# Patient Record
Sex: Female | Born: 1980 | Hispanic: Yes | Marital: Single | Smoking: Never smoker
Health system: Southern US, Community
[De-identification: ages and names within clinical notes are randomized; demographics above are authoritative.]

## PROBLEM LIST (undated history)

## (undated) ENCOUNTER — Inpatient Hospital Stay: Payer: Self-pay

## (undated) DIAGNOSIS — O09529 Supervision of elderly multigravida, unspecified trimester: Secondary | ICD-10-CM

## (undated) DIAGNOSIS — D649 Anemia, unspecified: Secondary | ICD-10-CM

## (undated) DIAGNOSIS — O24419 Gestational diabetes mellitus in pregnancy, unspecified control: Secondary | ICD-10-CM

## (undated) HISTORY — DX: Gestational diabetes mellitus in pregnancy, unspecified control: O24.419

## (undated) HISTORY — PX: CHOLECYSTECTOMY: SHX55

## (undated) HISTORY — DX: Supervision of elderly multigravida, unspecified trimester: O09.529

---

## 2005-03-25 ENCOUNTER — Emergency Department: Payer: Self-pay | Admitting: Emergency Medicine

## 2005-04-29 ENCOUNTER — Inpatient Hospital Stay: Payer: Self-pay | Admitting: Surgery

## 2007-07-07 ENCOUNTER — Emergency Department: Payer: Self-pay | Admitting: Emergency Medicine

## 2008-09-01 ENCOUNTER — Emergency Department: Payer: Self-pay | Admitting: Emergency Medicine

## 2013-07-29 ENCOUNTER — Emergency Department (HOSPITAL_COMMUNITY)
Admission: EM | Admit: 2013-07-29 | Discharge: 2013-07-30 | Disposition: A | Discharge status: Home / Self Care | Payer: Self-pay | Attending: Emergency Medicine | Admitting: Emergency Medicine

## 2013-07-29 ENCOUNTER — Encounter (HOSPITAL_COMMUNITY): Payer: Self-pay | Admitting: Emergency Medicine

## 2013-07-29 DIAGNOSIS — R1012 Left upper quadrant pain: Secondary | ICD-10-CM | POA: Insufficient documentation

## 2013-07-29 DIAGNOSIS — R1013 Epigastric pain: Secondary | ICD-10-CM | POA: Insufficient documentation

## 2013-07-29 DIAGNOSIS — Z79899 Other long term (current) drug therapy: Secondary | ICD-10-CM | POA: Insufficient documentation

## 2013-07-29 DIAGNOSIS — N390 Urinary tract infection, site not specified: Secondary | ICD-10-CM | POA: Insufficient documentation

## 2013-07-29 DIAGNOSIS — R1011 Right upper quadrant pain: Secondary | ICD-10-CM | POA: Insufficient documentation

## 2013-07-29 DIAGNOSIS — R112 Nausea with vomiting, unspecified: Secondary | ICD-10-CM | POA: Insufficient documentation

## 2013-07-29 LAB — CBC WITH DIFFERENTIAL/PLATELET
Basophils Absolute: 0 10*3/uL (ref 0.0–0.1)
Basophils Relative: 0 % (ref 0–1)
Eosinophils Absolute: 0.2 10*3/uL (ref 0.0–0.7)
Eosinophils Relative: 3 % (ref 0–5)
HCT: 39.4 % (ref 36.0–46.0)
Hemoglobin: 13.4 g/dL (ref 12.0–15.0)
MCH: 30.7 pg (ref 26.0–34.0)
MCHC: 34 g/dL (ref 30.0–36.0)
Monocytes Absolute: 0.5 10*3/uL (ref 0.1–1.0)
Monocytes Relative: 6 % (ref 3–12)
Neutro Abs: 4.4 10*3/uL (ref 1.7–7.7)
Neutrophils Relative %: 58 % (ref 43–77)
Platelets: 270 10*3/uL (ref 150–400)
RDW: 13.2 % (ref 11.5–15.5)

## 2013-07-29 LAB — COMPREHENSIVE METABOLIC PANEL
AST: 43 U/L — ABNORMAL HIGH (ref 0–37)
Albumin: 3.4 g/dL — ABNORMAL LOW (ref 3.5–5.2)
Alkaline Phosphatase: 96 U/L (ref 39–117)
BUN: 15 mg/dL (ref 6–23)
Calcium: 9.4 mg/dL (ref 8.4–10.5)
Chloride: 103 mEq/L (ref 96–112)
Creatinine, Ser: 0.69 mg/dL (ref 0.50–1.10)
Sodium: 138 mEq/L (ref 135–145)
Total Protein: 7.3 g/dL (ref 6.0–8.3)

## 2013-07-29 LAB — URINALYSIS, ROUTINE W REFLEX MICROSCOPIC
Nitrite: NEGATIVE
Specific Gravity, Urine: 1.023 (ref 1.005–1.030)
Urobilinogen, UA: 1 mg/dL (ref 0.0–1.0)
pH: 7 (ref 5.0–8.0)

## 2013-07-29 LAB — URINE MICROSCOPIC-ADD ON

## 2013-07-29 LAB — LIPASE, BLOOD: Lipase: 64 U/L — ABNORMAL HIGH (ref 11–59)

## 2013-07-29 LAB — POCT I-STAT TROPONIN I

## 2013-07-29 MED ORDER — ONDANSETRON 4 MG PO TBDP
4.0000 mg | ORAL_TABLET | Freq: Once | ORAL | Status: AC
  Administered 2013-07-29: 4 mg via ORAL
  Filled 2013-07-29: qty 1

## 2013-07-29 MED ORDER — ONDANSETRON HCL 4 MG/2ML IJ SOLN: 4.0000 mg | Freq: Once | INTRAMUSCULAR | Status: DC

## 2013-07-29 MED ORDER — NITROFURANTOIN MONOHYD MACRO 100 MG PO CAPS: 100.0000 mg | ORAL_CAPSULE | Freq: Two times a day (BID) | ORAL | Status: DC

## 2013-07-29 MED ORDER — GI COCKTAIL ~~LOC~~
30.0000 mL | Freq: Once | ORAL | Status: AC
  Administered 2013-07-29: 30 mL via ORAL
  Filled 2013-07-29: qty 30

## 2013-07-29 MED ORDER — PROMETHAZINE HCL 25 MG PO TABS: 25.0000 mg | ORAL_TABLET | Freq: Four times a day (QID) | ORAL | Status: DC | PRN

## 2013-07-29 MED ORDER — OMEPRAZOLE 20 MG PO CPDR: 20.0000 mg | DELAYED_RELEASE_CAPSULE | Freq: Every day | ORAL | Status: DC

## 2013-07-29 NOTE — ED Provider Notes (Signed)
CSN: [id number]     Arrival date & time 07/29/13  2049 History   First MD Initiated Contact with Patient 07/29/13 2135     Chief Complaint  Patient presents with  . Abdominal Pain  . Emesis   (Consider location/radiation/quality/duration/timing/severity/associated sxs/prior Treatment) HPI Comments: Patient presents with a chief complaint of epigastric abdominal pain.  She reports that the pain has been constant since yesterday.  Pain becomes worse after eating.  She describes the pain as a burning pain and states that it does not radiate.  She states that she had three episodes of vomiting earlier today.  She denies any blood in her emesis.  She reports that she took some type of heartburn medication earlier today, which gave her temporary relief.  She denies diarrhea.  Last BM was earlier today, which she reports is normal.  She denies fever or chills.  Denies urinary symptoms.  Denies chest pain or SOB.  PMH significant for Cholecystectomy six years ago.    The history is provided by the patient. The history is limited by a language barrier. A language interpreter was used (phone interpretor used).    History reviewed. No pertinent past medical history. Past Surgical History  Procedure Laterality Date  . Cholecystectomy     No family history on file. History  Substance Use Topics  . Smoking status: Never Smoker   . Smokeless tobacco: Never Used  . Alcohol Use: No   OB History   Grav Para Term Preterm Abortions TAB SAB Ect Mult Living                 Review of Systems  Gastrointestinal: Positive for nausea, vomiting and abdominal pain. Negative for abdominal distention.  All other systems reviewed and are negative.    Allergies  Review of patient's allergies indicates no known allergies.  Home Medications   Current Outpatient Rx  Name  Route  Sig  Dispense  Refill  . ranitidine (ZANTAC) 150 MG tablet   Oral   Take 150 mg by mouth 2 (two) times daily.          BP  109/65  Pulse 62  Temp(Src) 98.4 F (36.9 C) (Oral)  Resp 16  SpO2 98%  LMP 07/23/2013 Physical Exam  Nursing note and vitals reviewed. Constitutional: She appears well-developed and well-nourished.  HENT:  Head: Normocephalic and atraumatic.  Mouth/Throat: Oropharynx is clear and moist.  Neck: Normal range of motion. Neck supple.  Cardiovascular: Normal rate, regular rhythm and normal heart sounds.   Pulmonary/Chest: Effort normal and breath sounds normal. No respiratory distress. She has no wheezes. She has no rales.  Abdominal: Soft. Bowel sounds are normal. She exhibits no distension and no mass. There is tenderness in the right upper quadrant, epigastric area and left upper quadrant. There is no rebound, no guarding and negative Murphy's sign.  Obese abdomen  Neurological: She is alert.  Skin: Skin is warm and dry.  Psychiatric: She has a normal mood and affect.    ED Course  Procedures (including critical care time) Labs Review Labs Reviewed  CBC WITH DIFFERENTIAL  COMPREHENSIVE METABOLIC PANEL  LIPASE, BLOOD  URINALYSIS, ROUTINE W REFLEX MICROSCOPIC   Imaging Review No results found.   Date: 07/29/2013  Rate: 59  Rhythm: sinus bradycardia  QRS Axis: normal  Intervals: normal  ST/T Wave abnormalities: normal  Conduction Disutrbances:none  Narrative Interpretation:   Old EKG Reviewed: none available  Patient discussed with Dr. Fonnie Jarvis.  11:32 PM Reassessed patient.  She reports that her pain has improved at this time.  MDM  No diagnosis found. Patient presenting with epigastric abdominal pain and vomiting.  Patient afebrile.  LFT's and lipase mildly elevated, but labs otherwise unremarkable.  WBC is WNL.  PMH significant for Cholecystectomy six years ago.  Pain and nausea improved after given GI cocktail and Zofran.  On exam mild epigastric tenderness, no rebound or guarding.  Patient stable for discharge.  Patient started on PPI and given referral to GI.   Return precautions given.    Pascal Lux [place], PA-C 07/30/13 [phone]

## 2013-07-29 NOTE — ED Notes (Signed)
Pt does not speak [place], Spanish is primary language. Pt's daughter is helping to communicate with pt. Pt states that she has epigastric abdominal pain x 2 days. She has not been able to keep much down and when she eats vomits "yellow liquid."

## 2013-07-30 NOTE — ED Provider Notes (Signed)
Medical screening examination/treatment/procedure(s) were performed by non-physician practitioner and as supervising physician I was immediately available for consultation/collaboration.  Dewon Mendizabal M Shaunae Sieloff, MD 07/30/13 1333 

## 2013-07-31 LAB — URINE CULTURE: Colony Count: 70000

## 2013-08-09 ENCOUNTER — Emergency Department: Payer: Self-pay | Admitting: Internal Medicine

## 2013-08-09 LAB — URINALYSIS, COMPLETE
Nitrite: NEGATIVE
Ph: 6 (ref 4.5–8.0)
Protein: NEGATIVE
Specific Gravity: 1.017 (ref 1.003–1.030)
Squamous Epithelial: 6
WBC UR: 10 /HPF (ref 0–5)

## 2013-08-09 LAB — CBC
HCT: 37.1 % (ref 35.0–47.0)
MCH: 31 pg (ref 26.0–34.0)
MCHC: 34 g/dL (ref 32.0–36.0)
Platelet: 238 10*3/uL (ref 150–440)
RBC: 4.07 10*6/uL (ref 3.80–5.20)
RDW: 14 % (ref 11.5–14.5)
WBC: 6.8 10*3/uL (ref 3.6–11.0)

## 2013-08-09 LAB — COMPREHENSIVE METABOLIC PANEL
BUN: 9 mg/dL (ref 7–18)
Calcium, Total: 8.8 mg/dL (ref 8.5–10.1)
Co2: 28 mmol/L (ref 21–32)
EGFR (Non-African Amer.): >60
Osmolality: 275 (ref 275–301)
SGOT(AST): 39 U/L — ABNORMAL HIGH (ref 15–37)
Sodium: 138 mmol/L (ref 136–145)

## 2013-08-18 ENCOUNTER — Emergency Department: Payer: Self-pay | Admitting: Emergency Medicine

## 2013-11-10 DIAGNOSIS — O24419 Gestational diabetes mellitus in pregnancy, unspecified control: Secondary | ICD-10-CM

## 2013-11-10 HISTORY — DX: Gestational diabetes mellitus in pregnancy, unspecified control: O24.419

## 2014-07-18 ENCOUNTER — Other Ambulatory Visit (HOSPITAL_COMMUNITY): Payer: Self-pay | Admitting: Physician Assistant

## 2014-07-18 DIAGNOSIS — Z3689 Encounter for other specified antenatal screening: Secondary | ICD-10-CM

## 2014-07-18 LAB — OB RESULTS CONSOLE VARICELLA ZOSTER ANTIBODY, IGG: Varicella: IMMUNE

## 2014-07-18 LAB — SICKLE CELL SCREEN: Sickle Cell Screen: NORMAL

## 2014-07-18 LAB — OB RESULTS CONSOLE HGB/HCT, BLOOD
HEMATOCRIT: 36 %
HEMOGLOBIN: 11.5 g/dL

## 2014-07-18 LAB — OB RESULTS CONSOLE GC/CHLAMYDIA
Chlamydia: NEGATIVE
Gonorrhea: NEGATIVE

## 2014-07-18 LAB — OB RESULTS CONSOLE HIV ANTIBODY (ROUTINE TESTING): HIV: NONREACTIVE

## 2014-07-18 LAB — OB RESULTS CONSOLE ANTIBODY SCREEN: Antibody Screen: NEGATIVE

## 2014-07-18 LAB — CULTURE, OB URINE: Urine Culture, OB: NEGATIVE

## 2014-07-18 LAB — CYTOLOGY - PAP: CYTOLOGY - PAP: NEGATIVE

## 2014-07-18 LAB — OB RESULTS CONSOLE ABO/RH: RH Type: POSITIVE

## 2014-07-18 LAB — CYSTIC FIBROSIS DIAGNOSTIC STUDY: Interpretation-CFDNA:: NEGATIVE

## 2014-07-18 LAB — OB RESULTS CONSOLE PLATELET COUNT: Platelets: 226 10*3/uL

## 2014-07-18 LAB — GLUCOSE TOLERANCE, 1 HOUR (50G) W/O FASTING: GLUCOSE 1 HOUR GTT: 111

## 2014-07-18 LAB — OB RESULTS CONSOLE RUBELLA ANTIBODY, IGM: Rubella: IMMUNE

## 2014-07-21 ENCOUNTER — Ambulatory Visit (HOSPITAL_COMMUNITY)
Admission: RE | Admit: 2014-07-21 | Discharge: 2014-07-21 | Disposition: A | Discharge status: Home / Self Care | Payer: Medicaid Other | Source: Ambulatory Visit | Attending: Physician Assistant | Admitting: Physician Assistant

## 2014-07-21 ENCOUNTER — Other Ambulatory Visit (HOSPITAL_COMMUNITY): Payer: Self-pay | Admitting: Physician Assistant

## 2014-07-21 DIAGNOSIS — O358XX0 Maternal care for other (suspected) fetal abnormality and damage, not applicable or unspecified: Secondary | ICD-10-CM | POA: Insufficient documentation

## 2014-07-21 DIAGNOSIS — Z3689 Encounter for other specified antenatal screening: Secondary | ICD-10-CM | POA: Insufficient documentation

## 2014-07-25 ENCOUNTER — Ambulatory Visit (HOSPITAL_COMMUNITY): Payer: Self-pay | Attending: Physician Assistant

## 2014-08-02 ENCOUNTER — Other Ambulatory Visit (HOSPITAL_COMMUNITY): Payer: Self-pay | Admitting: Maternal and Fetal Medicine

## 2014-08-02 DIAGNOSIS — O283 Abnormal ultrasonic finding on antenatal screening of mother: Secondary | ICD-10-CM

## 2014-08-04 ENCOUNTER — Encounter (HOSPITAL_COMMUNITY): Payer: Self-pay

## 2014-08-04 ENCOUNTER — Ambulatory Visit (HOSPITAL_COMMUNITY)
Admission: RE | Admit: 2014-08-04 | Discharge: 2014-08-04 | Disposition: A | Discharge status: Home / Self Care | Payer: Medicaid Other | Source: Ambulatory Visit | Attending: Physician Assistant | Admitting: Physician Assistant

## 2014-08-04 VITALS — BP 124/65 | HR 79

## 2014-08-04 DIAGNOSIS — Z3689 Encounter for other specified antenatal screening: Secondary | ICD-10-CM | POA: Diagnosis not present

## 2014-08-04 DIAGNOSIS — O09522 Supervision of elderly multigravida, second trimester: Secondary | ICD-10-CM

## 2014-08-04 DIAGNOSIS — O09529 Supervision of elderly multigravida, unspecified trimester: Secondary | ICD-10-CM | POA: Insufficient documentation

## 2014-08-04 DIAGNOSIS — O283 Abnormal ultrasonic finding on antenatal screening of mother: Secondary | ICD-10-CM

## 2014-08-04 DIAGNOSIS — O289 Unspecified abnormal findings on antenatal screening of mother: Secondary | ICD-10-CM | POA: Insufficient documentation

## 2014-09-11 ENCOUNTER — Encounter (HOSPITAL_COMMUNITY): Payer: Self-pay

## 2014-09-27 LAB — OB RESULTS CONSOLE RPR: RPR: NONREACTIVE

## 2014-09-27 LAB — GLUCOSE TOLERANCE, 1 HOUR (50G) W/O FASTING: GLUCOSE 1 HOUR GTT: 170

## 2014-09-29 LAB — GLUCOSE TOLERANCE, 3 HOURS
GLUCOSE 1 HOUR GTT: 156 mg/dL (ref ?–200)
GLUCOSE 2 HOUR GTT: 184 mg/dL — AB (ref ?–140)
Glucose, Fasting: 69 mg/dL (ref 60–109)
Glucose, GTT - 3 Hour: 158 mg/dL — AB (ref ?–140)

## 2014-10-09 ENCOUNTER — Encounter: Payer: Medicaid Other | Attending: Family Medicine | Admitting: *Deleted

## 2014-10-09 ENCOUNTER — Ambulatory Visit (INDEPENDENT_AMBULATORY_CARE_PROVIDER_SITE_OTHER): Payer: Medicaid Other | Admitting: Family Medicine

## 2014-10-09 VITALS — BP 97/63 | HR 81 | Temp 97.7°F | Ht 59.0 in | Wt 146.7 lb

## 2014-10-09 DIAGNOSIS — O0933 Supervision of pregnancy with insufficient antenatal care, third trimester: Secondary | ICD-10-CM

## 2014-10-09 DIAGNOSIS — Z23 Encounter for immunization: Secondary | ICD-10-CM

## 2014-10-09 DIAGNOSIS — O09529 Supervision of elderly multigravida, unspecified trimester: Secondary | ICD-10-CM | POA: Insufficient documentation

## 2014-10-09 DIAGNOSIS — Z550 Illiteracy and low-level literacy: Secondary | ICD-10-CM | POA: Insufficient documentation

## 2014-10-09 DIAGNOSIS — O09523 Supervision of elderly multigravida, third trimester: Secondary | ICD-10-CM

## 2014-10-09 DIAGNOSIS — Z713 Dietary counseling and surveillance: Secondary | ICD-10-CM | POA: Insufficient documentation

## 2014-10-09 DIAGNOSIS — O24419 Gestational diabetes mellitus in pregnancy, unspecified control: Secondary | ICD-10-CM | POA: Insufficient documentation

## 2014-10-09 DIAGNOSIS — Z3493 Encounter for supervision of normal pregnancy, unspecified, third trimester: Secondary | ICD-10-CM

## 2014-10-09 DIAGNOSIS — O283 Abnormal ultrasonic finding on antenatal screening of mother: Secondary | ICD-10-CM | POA: Insufficient documentation

## 2014-10-09 DIAGNOSIS — O0930 Supervision of pregnancy with insufficient antenatal care, unspecified trimester: Secondary | ICD-10-CM | POA: Insufficient documentation

## 2014-10-09 LAB — POCT URINALYSIS DIP (DEVICE)
Bilirubin Urine: NEGATIVE
GLUCOSE, UA: NEGATIVE mg/dL
Hgb urine dipstick: NEGATIVE
KETONES UR: NEGATIVE mg/dL
LEUKOCYTES UA: NEGATIVE
NITRITE: NEGATIVE
Protein, ur: NEGATIVE mg/dL
Specific Gravity, Urine: 1.02 (ref 1.005–1.030)
Urobilinogen, UA: 0.2 mg/dL (ref 0.0–1.0)
pH: 7 (ref 5.0–8.0)

## 2014-10-09 MED ORDER — GLUCOSE BLOOD VI STRP: ORAL_STRIP | Status: DC

## 2014-10-09 MED ORDER — ACCU-CHEK FASTCLIX LANCETS MISC: 1.0000 | Freq: Four times a day (QID) | Status: DC

## 2014-10-09 NOTE — Patient Instructions (Signed)
Tercer trimestre del embarazo  (Third Trimester of Pregnancy)  El tercer trimestre del embarazo abarca desde la semana 29 hasta la semana 42, desde el 7 mes hasta el 9. En este trimestre el beb (feto) se desarrolla muy rpidamente. Hacia el final del noveno mes, el beb que an no ha nacido mide alrededor de 20 pulgadas (45 cm) de largo. Y pesa entre 6 y 10 libras (2,700 y 4,500 kg).  CUIDADOS EN EL HOGAR   Evite fumar, consumir hierbas y beber alcohol. Evite los frmacos que no apruebe el mdico.  Slo tome los medicamentos que le haya indicado su mdico. Algunos medicamentos son seguros para tomar durante el embarazo y otros no lo son.  Haga ejercicios slo como le indique el mdico. Deje de hacer ejercicios si comienza a tener clicos.  Haga comidas regulares y sanas.  Use un sostn que le brinde buen soporte si sus mamas estn sensibles.  No utilice la baera con agua caliente, baos turcos y saunas.  Colquese el cinturn de seguridad cuando conduzca.  Evite comer carne cruda y el contacto con los utensilios y desperdicios de los gatos.  Tome las vitaminas indicadas para la etapa prenatal.  Trate de tomar medicamentos para mover el intestino (laxantes) segn lo necesario y si su mdico la autoriza. Consuma ms fibra comiendo frutas y vegetales frescos y granos enteros. Beba gran cantidad de lquido para mantener el pis (orina) de tono claro o amarillo plido.  Tome baos de agua tibia (baos de asiento) para calmar el dolor o las molestias causadas por las hemorroides. Use una crema para las hemorroides si el mdico la autoriza.  Si tiene venas hinchadas y abultadas (venas varicosas), use medias de soporte. Eleve (levante) los pies durante 15 minutos, 3 o 4 veces por da. Limite el consumo de sal en su dieta.  Evite levantar objetos pesados, usar tacones altos y sintese derecha.  Descanse con las piernas elevadas si tiene calambres o dolor de cintura.  Visite a su dentista  si no lo ha hecho durante el embarazo. Use un cepillo de dientes blando para higienizarse los dientes. Use suavemente el hilo dental.  Puede tener sexo (relaciones sexuales) siempre que el mdico la autorice.  No viaje por largas distancias si puede evitarlo. Slo hgalo con la aprobacin de su mdico.  Haga el curso pre parto.  Practique conducir hasta el hospital.  Prepare el bolso que llevar.  Prepare la habitacin del beb.  Concurra a los controles mdicos. SOLICITE AYUDA SI:   No est segura si est en trabajo de parto o ha roto la bolsa de aguas.  Tiene mareos.  Siente clicos intensos o presin en la zona baja del vientre (abdomen).  Siente un dolor persistente en la zona del vientre.  Tiene malestar estomacal (nuseas), devuelve (vomita), o tiene deposiciones acuosas (diarrea).  Advierte un olor ftido que proviene de la vagina.  Siente dolor al hacer pis (orinar). SOLICITE AYUDA DE INMEDIATO SI:   Tiene fiebre.  Pierde lquido o sangre por la vagina.  Tiene sangrando o pequeas prdidas vaginales.  Siente dolor intenso o clicos en el abdomen.  Sube o baja de peso rpidamente.  Tiene dificultad para respirar o siente dolor en el pecho.  Sbitamente se le hinchan el rostro, las manos, los tobillos, los pies o las piernas.  No ha sentido los movimientos del beb durante una hora.  Siente un dolor de cabeza intenso que no se alivia con medicamentos.  Su visin se modifica. Document   Released: 06/29/2013 [employer] Patient Information 2015 [employer]. This information is not intended to replace advice given to you by your health care provider. Make sure you discuss any questions you have with your health care provider.  

## 2014-10-09 NOTE — Progress Notes (Signed)
  Patient was seen on 10/09/14 for Gestational Diabetes self-management . Spanish Interpreter utilized. Colleen Thomas. The following learning objectives were met by the patient :  States the definition of Gestational Diabetes  States when to check blood glucose levels  Demonstrates proper blood glucose monitoring techniques  States the effect of stress and exercise on blood glucose levels  Plan:  Consider  increasing your activity level by walking daily as tolerated Begin checking BG before breakfast and 2 hours after first bit of breakfast, lunch and dinner after  as directed by MD  Take medication  as directed by MD  Blood glucose monitor given: Accu Chek Nano BG Monitoring Kit Lot # [id number] Exp: 06/10/15 Blood glucose reading: 3hpp $RemoveBefor'128mg'egcOOGRgQoVA$ /dl  Patient instructed to monitor glucose levels: FBS: 60 - <90 2 hour: <120  Patient received the following handouts:  Nutrition Diabetes and Pregnancy  Patient will be seen for follow-up as needed.

## 2014-10-09 NOTE — Progress Notes (Signed)
Darel HongMariaElena Jimenez Spanish Interpreter [facility] visit[facility]. Agricultural engineerducational material given. Flu vaccine/HIV testing:  Records need to be abstracted

## 2014-10-09 NOTE — Progress Notes (Signed)
[facility] for gestational diabetes and failed 3hr GTT.  Records reviewed.  Discussed with patient seriousness of GDM and goals of care.  Patient having some lower back pain, worse with walking.  Mostly on right side.  Having some paraspinal tenderness.  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  Patient tolerated well.   Patient to meet with Harriett SineNancy.

## 2014-10-09 NOTE — Progress Notes (Signed)
Nutrition note: 1st visit consult & GDM diet education Pt is newly diagnosed GDM pt  Pt has gained 11.7# @ 30w, which is wnl. Pt reports eating 2 meals & 2 snacks/d. Pt is not taking a PNV anymore (ran out). Pt reports no N/V or heartburn. NKFA. Pt reports walking most days for ~6040mins. Pt received verbal & pictorial education on GDM diet. Encouraged PNV. Discussed wt gain goals of 15-25# or 0.6#/wk. Pt agrees to follow GDM diet with 3 meals & 1-3 snacks/d with proper CHO/ protein combination. Pt has WIC & plans to BF. F/u in 4-6 wks Blondell RevealLaura Lehi Phifer, MS, RD, LDN, [facility]

## 2014-10-10 ENCOUNTER — Encounter (HOSPITAL_COMMUNITY): Payer: Self-pay

## 2014-10-10 LAB — HIV ANTIBODY (ROUTINE TESTING W REFLEX): HIV 1&2 Ab, 4th Generation: NONREACTIVE

## 2014-10-11 ENCOUNTER — Encounter: Payer: Self-pay | Admitting: *Deleted

## 2014-10-16 ENCOUNTER — Ambulatory Visit (INDEPENDENT_AMBULATORY_CARE_PROVIDER_SITE_OTHER): Payer: Medicaid Other | Admitting: Obstetrics & Gynecology

## 2014-10-16 ENCOUNTER — Encounter: Payer: Self-pay | Attending: Family Medicine | Admitting: *Deleted

## 2014-10-16 VITALS — BP 115/65 | HR 65 | Temp 98.0°F | Wt 148.5 lb

## 2014-10-16 DIAGNOSIS — Z713 Dietary counseling and surveillance: Secondary | ICD-10-CM | POA: Insufficient documentation

## 2014-10-16 DIAGNOSIS — O24419 Gestational diabetes mellitus in pregnancy, unspecified control: Secondary | ICD-10-CM

## 2014-10-16 LAB — POCT URINALYSIS DIP (DEVICE)
Bilirubin Urine: NEGATIVE
Glucose, UA: NEGATIVE mg/dL
Hgb urine dipstick: NEGATIVE
KETONES UR: NEGATIVE mg/dL
LEUKOCYTES UA: NEGATIVE
NITRITE: NEGATIVE
PROTEIN: NEGATIVE mg/dL
Specific Gravity, Urine: 1.025 (ref 1.005–1.030)
Urobilinogen, UA: 1 mg/dL (ref 0.0–1.0)
pH: 5.5 (ref 5.0–8.0)

## 2014-10-16 LAB — GLUCOSE, CAPILLARY: GLUCOSE-CAPILLARY: 107 mg/dL — AB (ref 70–99)

## 2014-10-16 NOTE — Progress Notes (Signed)
In consult with Dr. Gala Romney it is determined that patient is not testing appropriately. She presents with a log book with no valid data.  She was originally given a Accu-Chek and RX for supplies. The pharmacy stated that she needed to show a card to get her supplies. She states she went to the [facility] office and was told she would have to wait for a card to come.In conversation it was determined that although we show that she is Medicaid pending she is not eligible for Medicaid.  I Dispensed a new: True Track Glucometer Lot# [id number] Exp: 2016/11/22 Glucose: 107 18mns pp  Patient was seen on 10/16/14 for Gestational Diabetes self-management . The following learning objectives were met by the patient :   States why dietary management is important in controlling blood glucose  Describes the effects of carbohydrates on blood glucose levels  Demonstrates ability to create a balanced meal plan  States when to check blood glucose levels  Demonstrates proper blood glucose monitoring techniques  Plan:  Limit carbohydrate intake Always have protein with carbohydrates Begin checking BG before breakfast and 1-2 hours after first bit of breakfast, lunch and dinner after  as directed by MD   Patient instructed to monitor glucose levels: FBS: 60 - <90 2 hour: <120

## 2014-10-16 NOTE — Progress Notes (Signed)
Pt not checking CBGs correctly.  Patient ate bite of bread and some milk this morning.  Did not take fasting because something is broken.  Joint vivsit with Diabetes education.  They will discuss in detail appropriate testing schedule and diet.  No obstetrical complaint today.  CBG 1 1/2 hours after breakfast today is 107.

## 2014-10-23 ENCOUNTER — Ambulatory Visit (INDEPENDENT_AMBULATORY_CARE_PROVIDER_SITE_OTHER): Payer: Self-pay | Admitting: Obstetrics and Gynecology

## 2014-10-23 VITALS — BP 99/56 | HR 64 | Temp 97.7°F | Wt 148.8 lb

## 2014-10-23 DIAGNOSIS — O283 Abnormal ultrasonic finding on antenatal screening of mother: Secondary | ICD-10-CM

## 2014-10-23 DIAGNOSIS — O24419 Gestational diabetes mellitus in pregnancy, unspecified control: Secondary | ICD-10-CM

## 2014-10-23 DIAGNOSIS — O09523 Supervision of elderly multigravida, third trimester: Secondary | ICD-10-CM

## 2014-10-23 LAB — POCT URINALYSIS DIP (DEVICE)
Bilirubin Urine: NEGATIVE
Glucose, UA: NEGATIVE mg/dL
Hgb urine dipstick: NEGATIVE
Ketones, ur: NEGATIVE mg/dL
NITRITE: NEGATIVE
Protein, ur: NEGATIVE mg/dL
Specific Gravity, Urine: 1.02 (ref 1.005–1.030)
UROBILINOGEN UA: 0.2 mg/dL (ref 0.0–1.0)
pH: 7 (ref 5.0–8.0)

## 2014-10-23 MED ORDER — GLYBURIDE 5 MG PO TABS: 2.5000 mg | ORAL_TABLET | Freq: Two times a day (BID) | ORAL | Status: DC

## 2014-10-23 NOTE — Progress Notes (Signed)
Doing well. No complaints.  1. A2GDM. Brings blood sugars to clinic to review and > 50% of AM fasting and 2 hour postprandial blood sugars above goal. Start glyburide 2.5 mg BID. Again reviewed correct way to check blood glucoses. Will return to clinic in 1 week to review values and titrate medications as needed.  2. Routine PNC. Reviewed labs. FM/PTL precautions reviewed. RTC in 1 week.

## 2014-10-30 ENCOUNTER — Ambulatory Visit (INDEPENDENT_AMBULATORY_CARE_PROVIDER_SITE_OTHER): Payer: Self-pay | Admitting: Obstetrics and Gynecology

## 2014-10-30 ENCOUNTER — Encounter: Payer: Self-pay | Admitting: Obstetrics and Gynecology

## 2014-10-30 VITALS — BP 104/57 | HR 76 | Temp 98.0°F | Wt 149.8 lb

## 2014-10-30 DIAGNOSIS — O09523 Supervision of elderly multigravida, third trimester: Secondary | ICD-10-CM

## 2014-10-30 DIAGNOSIS — O24419 Gestational diabetes mellitus in pregnancy, unspecified control: Secondary | ICD-10-CM

## 2014-10-30 DIAGNOSIS — O0933 Supervision of pregnancy with insufficient antenatal care, third trimester: Secondary | ICD-10-CM

## 2014-10-30 DIAGNOSIS — O283 Abnormal ultrasonic finding on antenatal screening of mother: Secondary | ICD-10-CM

## 2014-10-30 LAB — POCT URINALYSIS DIP (DEVICE)
Bilirubin Urine: NEGATIVE
GLUCOSE, UA: NEGATIVE mg/dL
Hgb urine dipstick: NEGATIVE
Ketones, ur: NEGATIVE mg/dL
NITRITE: NEGATIVE
PROTEIN: NEGATIVE mg/dL
Specific Gravity, Urine: 1.015 (ref 1.005–1.030)
Urobilinogen, UA: 0.2 mg/dL (ref 0.0–1.0)
pH: 6.5 (ref 5.0–8.0)

## 2014-10-30 NOTE — Progress Notes (Signed)
Patient reports her blood sugar goes down in the morning sometimes.

## 2014-10-30 NOTE — Addendum Note (Signed)
Addended by: Jill SideAY, Giannie Soliday L on: 10/30/2014 09:26 AM   Modules accepted: Orders, Level of Service

## 2014-10-30 NOTE — Addendum Note (Signed)
Addended by: Jill SideAY, DIANE L on: 10/30/2014 09:23 AM   Modules accepted: Orders, Level of Service

## 2014-10-30 NOTE — Progress Notes (Signed)
Patient is doing well without complaints. FM/PTL precautions reviewed. CBGs 50% above values for both fasting and 2 hr pp. Patient is not checking her CBGs at appropriate times and is not adhering to the diet. Education again provided. Will continue with glyburide 2.5 mg BID. Will start twice weekly testing

## 2014-10-30 NOTE — Progress Notes (Signed)
BPP scheduled on 12/23

## 2014-11-01 ENCOUNTER — Ambulatory Visit (HOSPITAL_COMMUNITY)
Admission: RE | Admit: 2014-11-01 | Discharge: 2014-11-01 | Disposition: A | Discharge status: Home / Self Care | Payer: Self-pay | Source: Ambulatory Visit | Attending: Obstetrics and Gynecology | Admitting: Obstetrics and Gynecology

## 2014-11-01 DIAGNOSIS — O24419 Gestational diabetes mellitus in pregnancy, unspecified control: Secondary | ICD-10-CM | POA: Insufficient documentation

## 2014-11-02 DIAGNOSIS — Z3A33 33 weeks gestation of pregnancy: Secondary | ICD-10-CM | POA: Insufficient documentation

## 2014-11-06 ENCOUNTER — Ambulatory Visit (INDEPENDENT_AMBULATORY_CARE_PROVIDER_SITE_OTHER): Payer: Self-pay | Admitting: Family Medicine

## 2014-11-06 ENCOUNTER — Encounter: Payer: Self-pay | Admitting: Family Medicine

## 2014-11-06 ENCOUNTER — Encounter: Payer: Self-pay | Admitting: *Deleted

## 2014-11-06 VITALS — BP 97/54 | HR 79 | Temp 98.5°F | Wt 149.4 lb

## 2014-11-06 DIAGNOSIS — O24419 Gestational diabetes mellitus in pregnancy, unspecified control: Secondary | ICD-10-CM

## 2014-11-06 LAB — POCT URINALYSIS DIP (DEVICE)
Bilirubin Urine: NEGATIVE
GLUCOSE, UA: NEGATIVE mg/dL
Hgb urine dipstick: NEGATIVE
Ketones, ur: NEGATIVE mg/dL
NITRITE: NEGATIVE
Protein, ur: NEGATIVE mg/dL
Specific Gravity, Urine: 1.015 (ref 1.005–1.030)
UROBILINOGEN UA: 0.2 mg/dL (ref 0.0–1.0)
pH: 6.5 (ref 5.0–8.0)

## 2014-11-06 NOTE — Progress Notes (Signed)
Nutrition note: f/u re: GDM diet Spanish interpreter used: Elena. Pt has gained 14.4# @ 34w, which is wnl. Pt reports eating 3 meals & 1 snack/d (tres [employer] cake & milk lately - between lunch & dinner). Pt reports eating milk & bread for breakfast and eats last food ~7-8pm. Pt reports eating [employer] 1x/wk (Big Mac, fries, sweet tea =~10 CHO servings). Pt had a BS of 64 & felt shaky so had a piece of pie. Reviewed GDM diet & portion sizes. Discussed excess CHO in the cake and in her [employer] meal. Discussed treating low BS if <60. Pt agrees to start measuring her portions & make different choices for her snack & when she eats out. F/u in 2-4 wks Blondell RevealLaura Katara Griner, MS, RD, LDN, [facility]

## 2014-11-06 NOTE — Patient Instructions (Signed)
Diabetes mellitus gestacional (Gestational Diabetes Mellitus) La diabetes mellitus gestacional, ms comnmente conocida como diabetes gestacional es un tipo de diabetes que desarrollan algunas mujeres durante el embarazo. En la diabetes gestacional, el pncreas no produce suficiente insulina (una hormona) o las clulas son menos sensibles a la insulina producida (resistencia a la insulina), o ambas cosas. Normalmente, la insulina mueve los azcares de los alimentos a las clulas de los tejidos. Las clulas de los tejidos utilizan los azcares para obtener energa. La falta de insulina o la falta de una respuesta normal a la insulina hace que el exceso de azcar se acumule en la sangre en lugar de penetrar en las clulas de los tejidos. Como resultado, se producen niveles altos de azcar en la sangre (hiperglucemia). El efecto de los niveles altos de azcar (glucosa) puede causar muchos problemas.  FACTORES DE RIESGO Usted tiene mayor probabilidad de desarrollar diabetes gestacional si tiene antecedentes familiares de diabetes y tambin si tiene uno o ms de los siguientes factores de riesgo:  ndice de masa corporal superior a 30 (obesidad).  Embarazo previo con diabetes gestacional.  La edad avanzada en el momento del embarazo. Si se mantienen los niveles de glucosa en la sangre en un rango normal durante el embarazo, las mujeres pueden tener un embarazo saludable. Si los niveles de glucosa en la sangre no estn bien controlados, puede haber riesgos para usted, el feto o el recin nacido, o durante el trabajo de parto y el parto.  SNTOMAS  Si se presentan sntomas, stos son similares a los sntomas que normalmente experimentar durante el embarazo. Los sntomas de la diabetes gestacional son:   Aumento de la sed (polidipsia).  Aumento de la miccin (poliuria).  Orina con ms frecuencia durante la noche (nocturia).  Prdida de peso. La prdida de peso puede ser muy rpida.  Infecciones  frecuentes y recurrentes.  Cansancio (fatiga).  Debilidad.  Cambios en la visin, como visin borrosa.  Olor a fruta en el aliento.  Dolor abdominal. DIAGNSTICO La diabetes se diagnostica cuando hay aumento de los niveles de glucosa en la sangre. El nivel de glucosa en la sangre puede controlarse en uno o ms de los siguientes anlisis de sangre:  Medicin de glucosa en la sangre en ayunas. No se le permitir comer durante al menos 8 horas antes de que se tome una muestra de sangre.  Pruebas al azar de glucosa en la sangre. El nivel de glucosa en la sangre se controla en cualquier momento del da sin importar el momento en que haya comido.  Prueba de A1c (hemoglobina glucosilada) Una prueba de A1c proporciona informacin sobre el control de la glucosa en la sangre durante los ltimos 3 meses.  Prueba de tolerancia a la glucosa oral (PTGO). La glucosa en la sangre se mide despus de no haber comido (ayunas) durante una a tres horas y despus de beber una bebida que contenga glucosa. Dado que las hormonas que causan la resistencia a la insulina son ms altas alrededor de las semanas 24 a 28 de embarazo, generalmente se realiza una PTGO durante ese tiempo. Si tiene factores de riesgo de diabetes gestacional, su mdico puede hacerle estudios de deteccin antes de las 24semanas de embarazo. TRATAMIENTO   Usted tendr que tomar medicamentos para la diabetes o insulina diariamente para mantener los niveles de glucosa en la sangre en el rango deseado.  Usted tendr que combinar la dosis de insulina con la actividad fsica y la eleccin de alimentos saludables. El objetivo del   tratamiento es mantener el nivel de azcar en la sangre previo a comer (preprandial) y durante la noche entre 60 y 99mg/dl, durante todo el embarazo. El objetivo del tratamiento es mantener el nivel pico de azcar en la sangre despus de comer (glucosa posprandial) entre 100y 140mg/dl. INSTRUCCIONES PARA EL CUIDADO EN EL  HOGAR   Controle su nivel de hemoglobina A1c dos veces al ao.  Contrlese a diario el nivel de glucosa en la sangre segn las indicaciones de su mdico. Es comn realizar controles frecuentes de la glucosa en la sangre.  Supervise las cetonas en la orina cuando est enferma y segn las indicaciones de su mdico.  Tome el medicamento para la diabetes y adminstrese insulina segn las indicaciones de su mdico para mantener el nivel de glucosa en la sangre en el rango deseado.  Nunca se quede sin medicamento para la diabetes o sin insulina. Es necesario que la reciba todos los das.  Ajuste la insulina segn la ingesta de hidratos de carbono. Los hidratos de carbono pueden aumentar los niveles de glucosa en la sangre, pero deben incluirse en su dieta. Los hidratos de carbono aportan vitaminas, minerales y fibra que son una parte esencial de una dieta saludable. Los hidratos de carbono se encuentran en frutas, verduras, cereales integrales, productos lcteos, legumbres y alimentos que contienen azcares aadidos.  Consuma alimentos saludables. Alterne 3 comidas con 3 colaciones.  Aumente de peso saludablemente. El aumento del peso total vara de acuerdo con el ndice de masa corporal que tena antes del embarazo ([facility]).  Lleve una tarjeta de alerta mdica o use una pulsera o medalla de alerta mdica.  Lleve con usted una colacin de 15gramos de hidratos de carbono en todo momento para controlar los niveles bajos de glucosa en la sangre (hipoglucemia). Algunos ejemplos de colaciones de 15gramos de hidratos de carbono son los siguientes:  Tabletas de glucosa, 3 o 4.  Gel de glucosa, tubo de 15 gramos.  Pasas de uva, 2 cucharadas (24 g).  Caramelos de goma, 6.  Galletas de animales, 8.  Jugo de fruta, gaseosa comn, o leche descremada, 4 onzas (120 ml).  Pastillas de goma, 9.  Reconocer la hipoglucemia. Durante el embarazo la hipoglucemia se produce cuando hay niveles de glucosa en la  sangre de 60 mg/dl o menos. El riesgo de hipoglucemia aumenta durante el ayuno o cuando se saltea las comidas, durante o despus de realizar ejercicio intenso y mientras duerme. Los sntomas de hipoglucemia son:  Temblores o sacudidas.  Disminucin de la capacidad de concentracin.  Sudoracin.  Aumento de la frecuencia cardaca.  Dolor de cabeza.  Sequedad en la boca.  Hambre.  Irritabilidad.  Ansiedad.  Sueo agitado.  Alteracin del habla o de la coordinacin.  Confusin.  Tratar la hipoglucemia rpidamente. Si usted est alerta y puede tragar con seguridad, siga la regla de 15/15 que consiste en:  Tome entre 15 y 20gramos de glucosa de accin rpida o carbohidratos. Las opciones de accin rpida son un gel de glucosa, tabletas de glucosa, o 4 onzas (120 ml) de jugo de frutas, gaseosa comn, o leche baja en grasa.  Compruebe su nivel de glucosa en la sangre 15 minutos despus de tomar la glucosa.  Tome entre 15 y 20 gramos ms de glucosa si el nivel de glucosa en la sangre todava es de 70mg/dl o inferior.  Ingiera una comida o una colacin en el lapso de 1 hora una vez que los niveles de glucosa en la sangre vuelven   a la normalidad.  Est atento a la poliuria (miccin excesiva) y la polidipsia (sensacin de mucha sed), que son los primeros signos de la hiperglucemia. El reconocimiento temprano de la hiperglucemia permite un tratamiento oportuno. Trate la hiperglucemia segn le indic su mdico.  Haga actividad fsica por lo menos 30minutos al da o como lo indique su mdico. Se recomienda que 30 minutos despus de cada comida, realice diez minutos de actividad fsica para controlar los niveles de glucosa postprandial en la sangre.  Ajuste su dosis de insulina y la ingesta de alimentos, segn sea necesario, si inicia un nuevo ejercicio o deporte.  Siga su plan para los das de enfermedad cuando no pueda comer o beber como de costumbre.  Evite el tabaco y el  alcohol.  Concurra a todas las visitas de control como se lo haya indicado el mdico.  Siga el consejo del mdico respecto a los controles prenatales y posteriores al parto (postparto), las visitas, la planificacin de las comidas, el ejercicio, los medicamentos, las vitaminas, los anlisis de sangre, otras pruebas mdicas y actividades fsicas.  Realice diariamente el cuidado de la piel y de los pies. Examine su piel y los pies diariamente para ver si tiene cortes, moretones, enrojecimiento, problemas en las uas, sangrado, ampollas o llagas.  Cepllese los dientes y encas por lo menos dos veces al da y use hilo dental al menos una vez por da. Concurra regularmente a las visitas de control con el dentista.  Programe un examen de vista durante el primer trimestre de su embarazo o como lo indique su mdico.  Comparta su plan de control de diabetes en el trabajo o en la escuela.  Mantngase al da con las vacunas.  Aprenda a manejar el estrs.  Obtenga la mayor cantidad posible de informacin sobre la diabetes y solicite ayuda siempre que sea necesario.  Obtenga informacin sobre el amamantamiento y analice esta posibilidad.  Debe controlar el nivel de azcar en la sangre de 6a 12semanas despus del parto. Esto se hace con una prueba de tolerancia a la glucosa oral (PTGO). SOLICITE ATENCIN MDICA SI:   No puede comer alimentos o beber por ms de 6 horas.  Tuvo nuseas o ha vomitado durante ms de 6 horas.  Tiene un nivel de glucosa en la sangre de 200 mg/dl y cetonas en la orina.  Presenta algn cambio en el estado mental.  Desarrolla problemas de visin.  Sufre un dolor persistente de cabeza.  Siente dolor o molestias en la parte superior del abdomen.  Desarrolla una enfermedad grave adicional.  Tuvo diarrea durante ms de 6 horas.  Ha estado enfermo o ha tenido fiebre durante un par de das y no mejora. SOLICITE ATENCIN MDICA DE INMEDIATO SI:   Tiene dificultad  para respirar.  Ya no siente los movimientos del beb.  Est sangrando o tiene flujo vaginal.  Comienza a tener contracciones o trabajo de parto prematuro. ASEGRESE DE QUE:  Comprende estas instrucciones.  Controlar su afeccin.  Recibir ayuda de inmediato si no mejora o si empeora. Document Released: 08/06/2005 Document Revised: 03/13/2014 [employer] Patient Information 2015 [employer]. This information is not intended to replace advice given to you by your health care provider. Make sure you discuss any questions you have with your health care provider.  Lactancia materna (Breastfeeding) Decidir amamantar es una de las mejores elecciones que puede hacer por usted y su beb. El cambio hormonal durante el embarazo produce el desarrollo del tejido mamario y aumenta la cantidad   y el tamao de los conductos galactforos. Estas hormonas tambin permiten que las protenas, los azcares y las grasas de la sangre produzcan la leche materna en las glndulas productoras de leche. Las hormonas impiden que la leche materna sea liberada antes del nacimiento del beb, adems de impulsar el flujo de leche luego del nacimiento. Una vez que ha comenzado a amamantar, pensar en el beb, as como la succin o el llanto, pueden estimular la liberacin de leche de las glndulas productoras de leche.  LOS BENEFICIOS DE AMAMANTAR Para el beb  La primera leche (calostro) ayuda a mejorar el funcionamiento del sistema digestivo del beb.  La leche tiene anticuerpos que ayudan a prevenir las infecciones en el beb.  El beb tiene una menor incidencia de asma, alergias y del sndrome de muerte sbita del lactante.  Los nutrientes en la leche materna son mejores para el beb que la leche maternizada y estn preparados exclusivamente para cubrir las necesidades del beb.  La leche materna mejora el desarrollo cerebral del beb.  Es menos probable que el beb desarrolle otras enfermedades, como obesidad  infantil, asma o diabetes mellitus de tipo 2. Para usted   La lactancia materna favorece el desarrollo de un vnculo muy especial entre la madre y el beb.  Es conveniente. La leche materna siempre est disponible a la temperatura correcta y es econmica.  La lactancia materna ayuda a quemar caloras y a perder el peso ganado durante el embarazo.  Favorece la contraccin del tero al tamao que tena antes del embarazo de manera ms rpida y disminuye el sangrado (loquios) despus del parto.  La lactancia materna contribuye a reducir el riesgo de desarrollar diabetes mellitus de tipo 2, osteoporosis o cncer de mama o de ovario en el futuro. SIGNOS DE QUE EL BEB EST HAMBRIENTO Primeros signos de hambre  Aumenta su estado de alerta o actividad.  Se estira.  Mueve la cabeza de un lado a otro.  Mueve la cabeza y abre la boca cuando se le toca la mejilla o la comisura de la boca (reflejo de bsqueda).  Aumenta las vocalizaciones, tales como sonidos de succin, se relame los labios, emite arrullos, suspiros, o chirridos.  Mueve la mano hacia la boca.  Se chupa con ganas los dedos o las manos. Signos tardos de hambre  Est agitado.  Llora de manera intermitente. Signos de hambre extrema Los signos de hambre extrema requerirn que lo calme y lo consuele antes de que el beb pueda alimentarse adecuadamente. No espere a que se manifiesten los siguientes signos de hambre extrema para comenzar a amamantar:   Agitacin.  Llanto intenso y fuerte.   Gritos. INFORMACIN BSICA SOBRE LA LACTANCIA MATERNA Iniciacin de la lactancia materna  Encuentre un lugar cmodo para sentarse o acostarse, con un buen respaldo para el cuello y la espalda.  Coloque una almohada o una manta enrollada debajo del beb para acomodarlo a la altura de la mama (si est sentada). Las almohadas para amamantar se han diseado especialmente a fin de servir de apoyo para los brazos y el beb mientras  amamanta.  Asegrese de que el abdomen del beb est frente al suyo.  Masajee suavemente la mama. Con las yemas de los dedos, masajee la pared del pecho hacia el pezn en un movimiento circular. Esto estimula el flujo de leche. Es posible que deba continuar este movimiento mientras amamanta si la leche fluye lentamente.  Sostenga la mama con el pulgar por arriba del pezn y los otros   4 dedos por debajo de la mama. Asegrese de que los dedos se encuentren lejos del pezn y de la boca del beb.  Empuje suavemente los labios del beb con el pezn o con el dedo.  Cuando la boca del beb se abra lo suficiente, acrquelo rpidamente a la mama e introduzca todo el pezn y la zona oscura que lo rodea (areola), tanto como sea posible, dentro de la boca del beb.  Debe haber ms areola visible por arriba del labio superior del beb que por debajo del labio inferior.  La lengua del beb debe estar entre la enca inferior y la mama.  Asegrese de que la boca del beb est en la posicin correcta alrededor del pezn (prendida). Los labios del beb deben crear un sello sobre la mama y estar doblados hacia afuera (invertidos).  Es comn que el beb succione durante 2 a 3 minutos para que comience el flujo de leche materna. Cmo debe prenderse Es muy importante que le ensee al beb cmo prenderse adecuadamente a la mama. Si el beb no se prende adecuadamente, puede causarle dolor en el pezn y reducir la produccin de leche materna, y hacer que el beb tenga un escaso aumento de peso. Adems, si el beb no se prende adecuadamente al pezn, puede tragar aire durante la alimentacin. Esto puede causarle molestias al beb. Hacer eructar al beb al cambiar de mama puede ayudarlo a liberar el aire. Sin embargo, ensearle al beb cmo prenderse a la mama adecuadamente es la mejor manera de evitar que se sienta molesto por tragar aire mientras se alimenta. Signos de que el beb se ha prendido adecuadamente al pezn:    Tironea o succiona de modo silencioso, sin causarle dolor.  Se escucha que traga cada 3 o 4 succiones.   Hay movimientos musculares por arriba y por delante de sus odos al succionar. Signos de que el beb no se ha prendido adecuadamente al pezn:   Hace ruidos de succin o de chasquido mientras se alimenta.  Siente dolor en el pezn. Si cree que el beb no se prendi correctamente, deslice el dedo en la comisura de la boca y colquelo entre las encas del beb para interrumpir la succin. Intente comenzar a amamantar nuevamente. Signos de lactancia materna exitosa Signos del beb:   Disminuye gradualmente el nmero de succiones o cesa la succin por completo.  Se duerme.  Relaja el cuerpo.  Retiene una pequea cantidad de leche en la boca.  Se desprende solo del pecho. Signos que presenta usted:  Las mamas han aumentado la firmeza, el peso y el tamao 1 a 3 horas despus de amamantar.  Estn ms blandas inmediatamente despus de amamantar.  Un aumento del volumen de leche, y tambin un cambio en su consistencia y color se producen hacia el quinto da de lactancia materna.  Los pezones no duelen, ni estn agrietados ni sangran. Signos de que su beb recibe la cantidad de leche suficiente  Moja al menos 3 paales en 24 horas. La orina debe ser clara y de color amarillo plido a los 5 das de vida.  Defeca al menos 3 veces en 24 horas a los 5 das de vida. La materia fecal debe ser blanda y amarillenta.  Defeca al menos 3 veces en 24 horas a los 7 das de vida. La materia fecal debe ser grumosa y amarillenta.  No registra una prdida de peso mayor del 10% del peso al nacer durante los primeros 3 das de vida.    Aumenta de peso un promedio de 4 a 7onzas (113 a 198g) por semana despus de los 4 das de vida.  Aumenta de peso, diariamente, de manera uniforme a partir de los 5 das de vida, sin registrar prdida de peso despus de las 2semanas de vida. Despus de  alimentarse, es posible que el beb regurgite una pequea cantidad. Esto es frecuente. FRECUENCIA Y DURACIN DE LA LACTANCIA MATERNA El amamantamiento frecuente la ayudar a producir ms leche y a prevenir problemas de dolor en los pezones e hinchazn en las mamas. Alimente al beb cuando muestre signos de hambre o si siente la necesidad de reducir la congestin de las mamas. Esto se denomina "lactancia a demanda". Evite el uso del chupete mientras trabaja para establecer la lactancia (las primeras 4 a 6 semanas despus del nacimiento del beb). Despus de este perodo, podr ofrecerle un chupete. Las investigaciones demostraron que el uso del chupete durante el primer ao de vida del beb disminuye el riesgo de desarrollar el sndrome de muerte sbita del lactante (SMSL). Permita que el nio se alimente en cada mama todo lo que desee. Contine amamantando al beb hasta que haya terminado de alimentarse. Cuando el beb se desprende o se queda dormido mientras se est alimentando de la primera mama, ofrzcale la segunda. Debido a que, con frecuencia, los recin nacidos permanecen somnolientos las primeras semanas de vida, es posible que deba despertar al beb para alimentarlo. Los horarios de lactancia varan de un beb a otro. Sin embargo, las siguientes reglas pueden servir como gua para ayudarla a garantizar que el beb se alimenta adecuadamente:  Se puede amamantar a los recin nacidos (bebs de 4 semanas o menos de vida) cada 1 a 3 horas.  No deben transcurrir ms de 3 horas durante el da o 5 horas durante la noche sin que se amamante a los recin nacidos.  Debe amamantar al beb 8 veces como mnimo en un perodo de 24 horas, hasta que comience a introducir slidos en su dieta, a los 6 meses de vida aproximadamente. EXTRACCIN DE LECHE MATERNA La extraccin y el almacenamiento de la leche materna le permiten asegurarse de que el beb se alimente exclusivamente de leche materna, aun en momentos en  los que no puede amamantar. Esto tiene especial importancia si debe regresar al trabajo en el perodo en que an est amamantando o si no puede estar presente en los momentos en que el beb debe alimentarse. Su asesor en lactancia puede orientarla sobre cunto tiempo es seguro almacenar leche materna.  El sacaleche es un aparato que le permite extraer leche de la mama a un recipiente estril. Luego, la leche materna extrada puede almacenarse en un refrigerador o congelador. Algunos sacaleches son manuales, mientras que otros son elctricos. Consulte a su asesor en lactancia qu tipo ser ms conveniente para usted. Los sacaleches se pueden comprar; sin embargo, algunos hospitales y grupos de apoyo a la lactancia materna alquilan sacaleches mensualmente. Un asesor en lactancia puede ensearle cmo extraer leche materna manualmente, en caso de que prefiera no usar un sacaleche.  CMO CUIDAR LAS MAMAS DURANTE LA LACTANCIA MATERNA Los pezones se secan, agrietan y duelen durante la lactancia materna. Las siguientes recomendaciones pueden ayudarla a mantener las mamas humectadas y sanas:  Evite usar jabn en los pezones.  Use un sostn de soporte. Aunque no son esenciales, las camisetas sin mangas o los sostenes especiales para amamantar estn diseados para acceder fcilmente a las mamas, para amamantar sin tener que quitarse todo   el sostn o la camiseta. Evite usar sostenes con aro o sostenes muy ajustados.  Seque al aire sus pezones durante 3 a 4minutos despus de amamantar al beb.  Utilice solo apsitos de algodn en el sostn para absorber las prdidas de leche. La prdida de un poco de leche materna entre las tomas es normal.  Utilice lanolina sobre los pezones luego de amamantar. La lanolina ayuda a mantener la humedad normal de la piel. Si usa lanolina pura, no tiene que lavarse los pezones antes de volver a alimentar al beb. La lanolina pura no es txica para el beb. Adems, puede extraer  manualmente algunas gotas de leche materna y masajear suavemente esa leche sobre los pezones, para que la leche se seque al aire. Durante las primeras semanas despus de dar a luz, algunas mujeres pueden experimentar hinchazn en las mamas (congestin mamaria). La congestin puede hacer que sienta las mamas pesadas, calientes y sensibles al tacto. El pico de la congestin ocurre dentro de los 3 a 5 das despus del parto. Las siguientes recomendaciones pueden ayudarla a aliviar la congestin:  Vace por completo las mamas al amamantar o extraer leche. Puede aplicar calor hmedo en las mamas (en la ducha o con toallas hmedas para manos) antes de amamantar o extraer leche. Esto aumenta la circulacin y ayuda a que la leche fluya. Si el beb no vaca por completo las mamas cuando lo amamanta, extraiga la leche restante despus de que haya finalizado.  Use un sostn ajustado (para amamantar o comn) o una camiseta sin mangas durante 1 o 2 das para indicar al cuerpo que disminuya ligeramente la produccin de leche.  Aplique compresas de hielo sobre las mamas, a menos que le resulte demasiado incmodo.  Asegrese de que el beb est prendido y se encuentre en la posicin correcta mientras lo alimenta. Si la congestin persiste luego de 48 horas o despus de seguir estas recomendaciones, comunquese con su mdico o un asesor en lactancia. RECOMENDACIONES GENERALES PARA EL CUIDADO DE LA SALUD DURANTE LA LACTANCIA MATERNA  Consuma alimentos saludables. Alterne comidas y colaciones, y coma 3 de cada una por da. Dado que lo que come afecta la leche materna, es posible que algunas comidas hagan que su beb se vuelva ms irritable de lo habitual. Evite comer este tipo de alimentos si percibe que afectan de manera negativa al beb.  Beba leche, jugos de fruta y agua para satisfacer su sed (aproximadamente 10 vasos al da).  Descanse con frecuencia, reljese y tome sus vitaminas prenatales para evitar la  fatiga, el estrs y la anemia.  Contine con los autocontroles de la mama.  Evite masticar y fumar tabaco.  Evite el consumo de alcohol y drogas. Algunos medicamentos, que pueden ser perjudiciales para el beb, pueden pasar a travs de la leche materna. Es importante que consulte a su mdico antes de tomar cualquier medicamento, incluidos todos los medicamentos recetados y de venta libre, as como los suplementos vitamnicos y herbales. Puede quedar embarazada durante la lactancia. Si desea controlar la natalidad, consulte a su mdico cules son las opciones ms seguras para el beb. SOLICITE ATENCIN MDICA SI:   Usted siente que quiere dejar de amamantar o se siente frustrada con la lactancia.  Siente dolor en las mamas o en los pezones.  Sus pezones estn agrietados o sangran.  Sus pechos estn irritados, sensibles o calientes.  Tiene un rea hinchada en cualquiera de las mamas.  Siente escalofros o fiebre.  Tiene nuseas o vmitos.    Presenta una secrecin de otro lquido distinto de la leche materna de los pezones.  Sus mamas no se llenan antes de amamantar al beb para el quinto da despus del parto.  Se siente triste y deprimida.  El beb est demasiado somnoliento como para comer bien.  El beb tiene problemas para dormir.  Moja menos de 3 paales en 24 horas.  Defeca menos de 3 veces en 24 horas.  La piel del beb o la parte blanca de los ojos se vuelven amarillentas.  El beb no ha aumentado de peso a los 5 das de vida. SOLICITE ATENCIN MDICA DE INMEDIATO SI:   El beb est muy cansado (letargo) y no se quiere despertar para comer.  Le sube la fiebre sin causa. Document Released: 10/27/2005 Document Revised: 11/01/2013 ExitCare Patient Information 2015 [employer]. This information is not intended to replace advice given to you by your health care provider. Make sure you discuss any questions you have with your health care provider.  

## 2014-11-06 NOTE — Progress Notes (Signed)
Spanish interpreter: Michelle NasutiElena used FBS 83-120 (4 of 7 are out of range) 2 hour pp 64-204 (2 are out of range) Meter check reveals some BS which are much higher than what is recorded. Discussed problems with inconsistent reporting.--increased risk of stillbirth--will have pt. See diabetes educator. NST reviewed and reactive.

## 2014-11-06 NOTE — Progress Notes (Signed)
Used Interpreter Viviana SimplerAlis Herrera. States never took macrobid.

## 2014-11-06 NOTE — Progress Notes (Signed)
Spanish Interpreter: Michelle Nasutilena used In conversation with patient and review of glucose readings it was determined that the time on the meter was set incorrectly. I corrected the time. Patient was not testing at the appropriate times. FBS this AM 83mg /dl and 2hpp 53mg /dl. She noted that she felt like she needed some sugar. 4oz Coke given, Stated she felt better. PLAN: Test FBS, always eat breakfast, test 2hpp and have a snack, eat lunch and dinner test 2hpp and have a snack. Decrease eating bread products. Return 1 week to see me for review of glucose readings.

## 2014-11-07 LAB — HEMOGLOBIN A1C
HEMOGLOBIN A1C: 5.7 % — AB (ref <5.7)
Mean Plasma Glucose: 117 mg/dL — ABNORMAL HIGH (ref <117)

## 2014-11-09 ENCOUNTER — Encounter: Payer: Self-pay | Admitting: General Practice

## 2014-11-09 ENCOUNTER — Ambulatory Visit (INDEPENDENT_AMBULATORY_CARE_PROVIDER_SITE_OTHER): Payer: Self-pay | Admitting: General Practice

## 2014-11-09 VITALS — BP 94/58 | HR 69 | Wt 151.8 lb

## 2014-11-09 DIAGNOSIS — O24419 Gestational diabetes mellitus in pregnancy, unspecified control: Secondary | ICD-10-CM

## 2014-11-09 LAB — US OB FOLLOW UP

## 2014-11-09 NOTE — Progress Notes (Signed)
12/31 NST reviewed and reactive 

## 2014-11-10 NOTE — L&D Delivery Note (Signed)
Patient is [age] y.o. [id number] admitted in active labor from clinic, hx of A2DM currently on glyburide, Echogenic bowel noted on sono   Delivery Note At 1:26 PM a viable female was delivered via Vaginal, Spontaneous Delivery (Presentation: ; Occiput Anterior).  APGAR: 9, 9; weight pending  .   Placenta status: Intact, Spontaneous.  Cord: 3 vessels with the following complications: None.  Lower uterine segment initially very boggy => 1000mcg cytotec given PR, bladder drained via in and out cath  Anesthesia: Epidural  Episiotomy: None Lacerations: None Suture Repair: n/a Est. Blood Loss (mL): 350  Mom to postpartum.  Baby to Couplet care / Skin to Skin.  Deklynn Charlet ROCIO 12/08/2014, 2:07 PM

## 2014-11-13 ENCOUNTER — Ambulatory Visit (INDEPENDENT_AMBULATORY_CARE_PROVIDER_SITE_OTHER): Payer: Self-pay | Admitting: Obstetrics & Gynecology

## 2014-11-13 ENCOUNTER — Encounter: Payer: Self-pay | Admitting: *Deleted

## 2014-11-13 VITALS — BP 104/66 | HR 63 | Wt 151.6 lb

## 2014-11-13 DIAGNOSIS — O24419 Gestational diabetes mellitus in pregnancy, unspecified control: Secondary | ICD-10-CM

## 2014-11-13 LAB — POCT URINALYSIS DIP (DEVICE)
Bilirubin Urine: NEGATIVE
Glucose, UA: NEGATIVE mg/dL
Ketones, ur: NEGATIVE mg/dL
LEUKOCYTES UA: NEGATIVE
NITRITE: NEGATIVE
PROTEIN: NEGATIVE mg/dL
Specific Gravity, Urine: 1.015 (ref 1.005–1.030)
Urobilinogen, UA: 0.2 mg/dL (ref 0.0–1.0)
pH: 6.5 (ref 5.0–8.0)

## 2014-11-13 LAB — FETAL NONSTRESS TEST

## 2014-11-13 NOTE — Progress Notes (Signed)
NST reactive. No complaints. BG fasting all wnl, few PP 144, most nl and will continue glyburide

## 2014-11-13 NOTE — Patient Instructions (Signed)
Tercer trimestre de embarazo (Third Trimester of Pregnancy) El tercer trimestre va desde la semana29 hasta la 42, desde el sptimo hasta el noveno mes, y es la poca en la que el feto crece ms rpidamente. Hacia el final del noveno mes, el feto mide alrededor de 20pulgadas (45cm) de largo y pesa entre 6 y 10 libras (2,700 y 4,500kg).  CAMBIOS EN EL ORGANISMO Su organismo atraviesa por muchos cambios durante el embarazo, y estos varan de una mujer a otra.   Seguir aumentando de peso. Es de esperar que aumente entre 25 y 35libras (11 y 16kg) hacia el final del embarazo.  Podrn aparecer las primeras estras en las caderas, el abdomen y las mamas.  Puede tener necesidad de orinar con ms frecuencia porque el feto baja hacia la pelvis y ejerce presin sobre la vejiga.  Debido al embarazo podr sentir acidez estomacal con frecuencia.  Puede estar estreida, ya que ciertas hormonas enlentecen los movimientos de los msculos que empujan los desechos a travs de los intestinos.  Pueden aparecer hemorroides o abultarse e hincharse las venas (venas varicosas).  Puede sentir dolor plvico debido al aumento de peso y a que las hormonas del embarazo relajan las articulaciones entre los huesos de la pelvis. El dolor de espalda puede ser consecuencia de la sobrecarga de los msculos que soportan la postura.  Tal vez haya cambios en el cabello que pueden incluir su engrosamiento, crecimiento rpido y cambios en la textura. Adems, a algunas mujeres se les cae el cabello durante o despus del embarazo, o tienen el cabello seco o fino. Lo ms probable es que el cabello se le normalice despus del nacimiento del beb.  Las mamas seguirn creciendo y le dolern. A veces, puede haber una secrecin amarilla de las mamas llamada calostro.  El ombligo puede salir hacia afuera.  Puede sentir que le falta el aire debido a que se expande el tero.  Puede notar que el feto "baja" o lo siente ms bajo, en el  abdomen.  Puede tener una prdida de secrecin mucosa con sangre. Esto suele ocurrir en el trmino de unos pocos das a una semana antes de que comience el trabajo de parto.  El cuello del tero se vuelve delgado y blando (se borra) cerca de la fecha de parto. QU DEBE ESPERAR EN LOS EXMENES PRENATALES  Le harn exmenes prenatales cada 2semanas hasta la semana36. A partir de ese momento le harn exmenes semanales. Durante una visita prenatal de rutina:  La pesarn para asegurarse de que usted y el feto estn creciendo normalmente.  Le tomarn la presin arterial.  Le medirn el abdomen para controlar el desarrollo del beb.  Se escucharn los latidos cardacos fetales.  Se evaluarn los resultados de los estudios solicitados en visitas anteriores.  Le revisarn el cuello del tero cuando est prxima la fecha de parto para controlar si este se ha borrado. Alrededor de la semana36, el mdico le revisar el cuello del tero. Al mismo tiempo, realizar un anlisis de las secreciones del tejido vaginal. Este examen es para determinar si hay un tipo de bacteria, estreptococo Grupo B. El mdico le explicar esto con ms detalle. El mdico puede preguntarle lo siguiente:  Cmo le gustara que fuera el parto.  Cmo se siente.  Si siente los movimientos del beb.  Si ha tenido sntomas anormales, como prdida de lquido, sangrado, dolores de cabeza intensos o clicos abdominales.  Si tiene alguna pregunta. Otros exmenes o estudios de deteccin que pueden realizarse   durante el tercer trimestre incluyen lo siguiente:  Anlisis de sangre para controlar las concentraciones de hierro (anemia).  Controles fetales para determinar su salud, nivel de actividad y crecimiento. Si tiene alguna enfermedad o hay problemas durante el embarazo, le harn estudios. FALSO TRABAJO DE PARTO Es posible que sienta contracciones leves e irregulares que finalmente desaparecen. Se llaman contracciones de  Braxton Hicks o falso trabajo de parto. Las contracciones pueden durar horas, das o incluso semanas, antes de que el verdadero trabajo de parto se inicie. Si las contracciones ocurren a intervalos regulares, se intensifican o se hacen dolorosas, lo mejor es que la revise el mdico.  SIGNOS DE TRABAJO DE PARTO   Clicos de tipo menstrual.  Contracciones cada 5minutos o menos.  Contracciones que comienzan en la parte superior del tero y se extienden hacia abajo, a la zona inferior del abdomen y la espalda.  Sensacin de mayor presin en la pelvis o dolor de espalda.  Una secrecin de mucosidad acuosa o con sangre que sale de la vagina. Si tiene alguno de estos signos antes de la semana37 del embarazo, llame a su mdico de inmediato. Debe concurrir al hospital para que la controlen inmediatamente. INSTRUCCIONES PARA EL CUIDADO EN EL HOGAR   Evite fumar, consumir hierbas, beber alcohol y tomar frmacos que no le hayan recetado. Estas sustancias qumicas afectan la formacin y el desarrollo del beb.  Siga las indicaciones del mdico en relacin con el uso de medicamentos. Durante el embarazo, hay medicamentos que son seguros de tomar y otros que no.  Haga actividad fsica solo en la forma indicada por el mdico. Sentir clicos uterinos es un buen signo para detener la actividad fsica.  Contine comiendo alimentos que sanos con regularidad.  Use un sostn que le brinde buen soporte si le duelen las mamas.  No se d baos de inmersin en agua caliente, baos turcos ni saunas.  Colquese el cinturn de seguridad cuando conduzca.  No coma carne cruda ni queso sin cocinar; evite el contacto con las bandejas sanitarias de los gatos y la tierra que estos animales usan. Estos elementos contienen grmenes que pueden causar defectos congnitos en el beb.  Tome las vitaminas prenatales.  Si est estreida, pruebe un laxante suave (si el mdico lo autoriza). Consuma ms alimentos ricos en  fibra, como vegetales y frutas frescos y cereales integrales. Beba gran cantidad de lquido para mantener la orina de tono claro o color amarillo plido.  Dese baos de asiento con agua tibia para aliviar el dolor o las molestias causadas por las hemorroides. Use una crema para las hemorroides si el mdico la autoriza.  Si tiene venas varicosas, use medias de descanso. Eleve los pies durante 15minutos, 3 o 4veces por da. Limite la cantidad de sal en su dieta.  Evite levantar objetos pesados, use zapatos de tacones bajos y mantenga una buena postura.  Descanse con las piernas elevadas si tiene calambres o dolor de cintura.  Visite a su dentista si no lo ha hecho durante el embarazo. Use un cepillo de dientes blando para higienizarse los dientes y psese el hilo dental con suavidad.  Puede seguir manteniendo relaciones sexuales, a menos que el mdico le indique lo contrario.  No haga viajes largos excepto que sea absolutamente necesario y solo con la autorizacin del mdico.  Tome clases prenatales para entender, practicar y hacer preguntas sobre el trabajo de parto y el parto.  Haga un ensayo de la partida al hospital.  Prepare el bolso que   llevar al hospital.  Prepare la habitacin del beb.  Concurra a todas las visitas prenatales segn las indicaciones de su mdico. SOLICITE ATENCIN MDICA SI:  No est segura de que est en trabajo de parto o de que ha roto la bolsa de las aguas.  Tiene mareos.  Siente clicos leves, presin en la pelvis o dolor persistente en el abdomen.  Tiene nuseas, vmitos o diarrea persistentes.  Tiene secrecin vaginal con mal olor.  Siente dolor al orinar. SOLICITE ATENCIN MDICA DE INMEDIATO SI:   Tiene fiebre.  Tiene una prdida de lquido por la vagina.  Tiene sangrado o pequeas prdidas vaginales.  Siente dolor intenso o clicos en el abdomen.  Sube o baja de peso rpidamente.  Tiene dificultad para respirar y siente dolor de  pecho.  Sbitamente se le hinchan mucho el rostro, las manos, los tobillos, los pies o las piernas.  No ha sentido los movimientos del beb durante una hora.  Siente un dolor de cabeza intenso que no se alivia con medicamentos.  Hay cambios en la visin. Document Released: 08/06/2005 Document Revised: 11/01/2013 Exit[employer] Patient Information 2015 [employer]. This information is not intended to replace advice given to you by your health care provider. Make sure you discuss any questions you have with your health care provider.  

## 2014-11-13 NOTE — Progress Notes (Signed)
U/S 12/04/14 @ 1015a.  Spanish interpreter [place] present.

## 2014-11-16 ENCOUNTER — Ambulatory Visit (INDEPENDENT_AMBULATORY_CARE_PROVIDER_SITE_OTHER): Payer: Self-pay | Admitting: *Deleted

## 2014-11-16 VITALS — BP 99/64 | HR 74

## 2014-11-16 DIAGNOSIS — O24419 Gestational diabetes mellitus in pregnancy, unspecified control: Secondary | ICD-10-CM

## 2014-11-16 LAB — US OB FOLLOW UP

## 2014-11-16 NOTE — Progress Notes (Signed)
1/7 NST reviewed and reactive 

## 2014-11-20 ENCOUNTER — Ambulatory Visit (INDEPENDENT_AMBULATORY_CARE_PROVIDER_SITE_OTHER): Payer: Self-pay | Admitting: Family Medicine

## 2014-11-20 VITALS — BP 101/67 | HR 66 | Temp 97.3°F | Wt 150.5 lb

## 2014-11-20 DIAGNOSIS — O24419 Gestational diabetes mellitus in pregnancy, unspecified control: Secondary | ICD-10-CM

## 2014-11-20 DIAGNOSIS — O24415 Gestational diabetes mellitus in pregnancy, controlled by oral hypoglycemic drugs: Secondary | ICD-10-CM

## 2014-11-20 LAB — OB RESULTS CONSOLE GBS: GBS: NEGATIVE

## 2014-11-20 NOTE — Progress Notes (Signed)
Blanca used for interpreter; reports pelvic pressure

## 2014-11-20 NOTE — Progress Notes (Signed)
Spanish interpreter: Elane FritzBlanca used FBS 75-90 2 hour 75-144 BS are not quite right and this is due to her father being hospitalized but ok last week U/s for growth next week

## 2014-11-20 NOTE — Patient Instructions (Signed)
Diabetes mellitus gestacional (Gestational Diabetes Mellitus) La diabetes mellitus gestacional, ms comnmente conocida como diabetes gestacional es un tipo de diabetes que desarrollan algunas mujeres durante el embarazo. En la diabetes gestacional, el pncreas no produce suficiente insulina (una hormona) o las clulas son menos sensibles a la insulina producida (resistencia a la insulina), o ambas cosas. Normalmente, la insulina mueve los azcares de los alimentos a las clulas de los tejidos. Las clulas de los tejidos utilizan los azcares para obtener energa. La falta de insulina o la falta de una respuesta normal a la insulina hace que el exceso de azcar se acumule en la sangre en lugar de penetrar en las clulas de los tejidos. Como resultado, se producen niveles altos de azcar en la sangre (hiperglucemia). El efecto de los niveles altos de azcar (glucosa) puede causar muchos problemas.  FACTORES DE RIESGO Usted tiene mayor probabilidad de desarrollar diabetes gestacional si tiene antecedentes familiares de diabetes y tambin si tiene uno o ms de los siguientes factores de riesgo:  ndice de masa corporal superior a 30 (obesidad).  Embarazo previo con diabetes gestacional.  La edad avanzada en el momento del embarazo. Si se mantienen los niveles de glucosa en la sangre en un rango normal durante el embarazo, las mujeres pueden tener un embarazo saludable. Si los niveles de glucosa en la sangre no estn bien controlados, puede haber riesgos para usted, el feto o el recin nacido, o durante el trabajo de parto y el parto.  SNTOMAS  Si se presentan sntomas, stos son similares a los sntomas que normalmente experimentar durante el embarazo. Los sntomas de la diabetes gestacional son:   Aumento de la sed (polidipsia).  Aumento de la miccin (poliuria).  Orina con ms frecuencia durante la noche (nocturia).  Prdida de peso. La prdida de peso puede ser muy rpida.  Infecciones  frecuentes y recurrentes.  Cansancio (fatiga).  Debilidad.  Cambios en la visin, como visin borrosa.  Olor a fruta en el aliento.  Dolor abdominal. DIAGNSTICO La diabetes se diagnostica cuando hay aumento de los niveles de glucosa en la sangre. El nivel de glucosa en la sangre puede controlarse en uno o ms de los siguientes anlisis de sangre:  Medicin de glucosa en la sangre en ayunas. No se le permitir comer durante al menos 8 horas antes de que se tome una muestra de sangre.  Pruebas al azar de glucosa en la sangre. El nivel de glucosa en la sangre se controla en cualquier momento del da sin importar el momento en que haya comido.  Prueba de A1c (hemoglobina glucosilada) Una prueba de A1c proporciona informacin sobre el control de la glucosa en la sangre durante los ltimos 3 meses.  Prueba de tolerancia a la glucosa oral (PTGO). La glucosa en la sangre se mide despus de no haber comido (ayunas) durante una a tres horas y despus de beber una bebida que contenga glucosa. Dado que las hormonas que causan la resistencia a la insulina son ms altas alrededor de las semanas 24 a 28 de embarazo, generalmente se realiza una PTGO durante ese tiempo. Si tiene factores de riesgo de diabetes gestacional, su mdico puede hacerle estudios de deteccin antes de las 24semanas de embarazo. TRATAMIENTO   Usted tendr que tomar medicamentos para la diabetes o insulina diariamente para mantener los niveles de glucosa en la sangre en el rango deseado.  Usted tendr que combinar la dosis de insulina con la actividad fsica y la eleccin de alimentos saludables. El objetivo del   tratamiento es mantener el nivel de azcar en la sangre previo a comer (preprandial) y durante la noche entre 60 y 99mg/dl, durante todo el embarazo. El objetivo del tratamiento es mantener el nivel pico de azcar en la sangre despus de comer (glucosa posprandial) entre 100y 140mg/dl. INSTRUCCIONES PARA EL CUIDADO EN EL  HOGAR   Controle su nivel de hemoglobina A1c dos veces al ao.  Contrlese a diario el nivel de glucosa en la sangre segn las indicaciones de su mdico. Es comn realizar controles frecuentes de la glucosa en la sangre.  Supervise las cetonas en la orina cuando est enferma y segn las indicaciones de su mdico.  Tome el medicamento para la diabetes y adminstrese insulina segn las indicaciones de su mdico para mantener el nivel de glucosa en la sangre en el rango deseado.  Nunca se quede sin medicamento para la diabetes o sin insulina. Es necesario que la reciba todos los das.  Ajuste la insulina segn la ingesta de hidratos de carbono. Los hidratos de carbono pueden aumentar los niveles de glucosa en la sangre, pero deben incluirse en su dieta. Los hidratos de carbono aportan vitaminas, minerales y fibra que son una parte esencial de una dieta saludable. Los hidratos de carbono se encuentran en frutas, verduras, cereales integrales, productos lcteos, legumbres y alimentos que contienen azcares aadidos.  Consuma alimentos saludables. Alterne 3 comidas con 3 colaciones.  Aumente de peso saludablemente. El aumento del peso total vara de acuerdo con el ndice de masa corporal que tena antes del embarazo ([facility]).  Lleve una tarjeta de alerta mdica o use una pulsera o medalla de alerta mdica.  Lleve con usted una colacin de 15gramos de hidratos de carbono en todo momento para controlar los niveles bajos de glucosa en la sangre (hipoglucemia). Algunos ejemplos de colaciones de 15gramos de hidratos de carbono son los siguientes:  Tabletas de glucosa, 3 o 4.  Gel de glucosa, tubo de 15 gramos.  Pasas de uva, 2 cucharadas (24 g).  Caramelos de goma, 6.  Galletas de animales, 8.  Jugo de fruta, gaseosa comn, o leche descremada, 4 onzas (120 ml).  Pastillas de goma, 9.  Reconocer la hipoglucemia. Durante el embarazo la hipoglucemia se produce cuando hay niveles de glucosa en la  sangre de 60 mg/dl o menos. El riesgo de hipoglucemia aumenta durante el ayuno o cuando se saltea las comidas, durante o despus de realizar ejercicio intenso y mientras duerme. Los sntomas de hipoglucemia son:  Temblores o sacudidas.  Disminucin de la capacidad de concentracin.  Sudoracin.  Aumento de la frecuencia cardaca.  Dolor de cabeza.  Sequedad en la boca.  Hambre.  Irritabilidad.  Ansiedad.  Sueo agitado.  Alteracin del habla o de la coordinacin.  Confusin.  Tratar la hipoglucemia rpidamente. Si usted est alerta y puede tragar con seguridad, siga la regla de 15/15 que consiste en:  Tome entre 15 y 20gramos de glucosa de accin rpida o carbohidratos. Las opciones de accin rpida son un gel de glucosa, tabletas de glucosa, o 4 onzas (120 ml) de jugo de frutas, gaseosa comn, o leche baja en grasa.  Compruebe su nivel de glucosa en la sangre 15 minutos despus de tomar la glucosa.  Tome entre 15 y 20 gramos ms de glucosa si el nivel de glucosa en la sangre todava es de 70mg/dl o inferior.  Ingiera una comida o una colacin en el lapso de 1 hora una vez que los niveles de glucosa en la sangre vuelven   a la normalidad.  Est atento a la poliuria (miccin excesiva) y la polidipsia (sensacin de mucha sed), que son los primeros signos de la hiperglucemia. El reconocimiento temprano de la hiperglucemia permite un tratamiento oportuno. Trate la hiperglucemia segn le indic su mdico.  Haga actividad fsica por lo menos 30minutos al da o como lo indique su mdico. Se recomienda que 30 minutos despus de cada comida, realice diez minutos de actividad fsica para controlar los niveles de glucosa postprandial en la sangre.  Ajuste su dosis de insulina y la ingesta de alimentos, segn sea necesario, si inicia un nuevo ejercicio o deporte.  Siga su plan para los das de enfermedad cuando no pueda comer o beber como de costumbre.  Evite el tabaco y el  alcohol.  Concurra a todas las visitas de control como se lo haya indicado el mdico.  Siga el consejo del mdico respecto a los controles prenatales y posteriores al parto (postparto), las visitas, la planificacin de las comidas, el ejercicio, los medicamentos, las vitaminas, los anlisis de sangre, otras pruebas mdicas y actividades fsicas.  Realice diariamente el cuidado de la piel y de los pies. Examine su piel y los pies diariamente para ver si tiene cortes, moretones, enrojecimiento, problemas en las uas, sangrado, ampollas o llagas.  Cepllese los dientes y encas por lo menos dos veces al da y use hilo dental al menos una vez por da. Concurra regularmente a las visitas de control con el dentista.  Programe un examen de vista durante el primer trimestre de su embarazo o como lo indique su mdico.  Comparta su plan de control de diabetes en el trabajo o en la escuela.  Mantngase al da con las vacunas.  Aprenda a manejar el estrs.  Obtenga la mayor cantidad posible de informacin sobre la diabetes y solicite ayuda siempre que sea necesario.  Obtenga informacin sobre el amamantamiento y analice esta posibilidad.  Debe controlar el nivel de azcar en la sangre de 6a 12semanas despus del parto. Esto se hace con una prueba de tolerancia a la glucosa oral (PTGO). SOLICITE ATENCIN MDICA SI:   No puede comer alimentos o beber por ms de 6 horas.  Tuvo nuseas o ha vomitado durante ms de 6 horas.  Tiene un nivel de glucosa en la sangre de 200 mg/dl y cetonas en la orina.  Presenta algn cambio en el estado mental.  Desarrolla problemas de visin.  Sufre un dolor persistente de cabeza.  Siente dolor o molestias en la parte superior del abdomen.  Desarrolla una enfermedad grave adicional.  Tuvo diarrea durante ms de 6 horas.  Ha estado enfermo o ha tenido fiebre durante un par de das y no mejora. SOLICITE ATENCIN MDICA DE INMEDIATO SI:   Tiene dificultad  para respirar.  Ya no siente los movimientos del beb.  Est sangrando o tiene flujo vaginal.  Comienza a tener contracciones o trabajo de parto prematuro. ASEGRESE DE QUE:  Comprende estas instrucciones.  Controlar su afeccin.  Recibir ayuda de inmediato si no mejora o si empeora. Document Released: 08/06/2005 Document Revised: 03/13/2014 [employer] Patient Information 2015 [employer]. This information is not intended to replace advice given to you by your health care provider. Make sure you discuss any questions you have with your health care provider.  Lactancia materna (Breastfeeding) Decidir amamantar es una de las mejores elecciones que puede hacer por usted y su beb. El cambio hormonal durante el embarazo produce el desarrollo del tejido mamario y aumenta la cantidad   y el tamao de los conductos galactforos. Estas hormonas tambin permiten que las protenas, los azcares y las grasas de la sangre produzcan la leche materna en las glndulas productoras de leche. Las hormonas impiden que la leche materna sea liberada antes del nacimiento del beb, adems de impulsar el flujo de leche luego del nacimiento. Una vez que ha comenzado a amamantar, pensar en el beb, as como la succin o el llanto, pueden estimular la liberacin de leche de las glndulas productoras de leche.  LOS BENEFICIOS DE AMAMANTAR Para el beb  La primera leche (calostro) ayuda a mejorar el funcionamiento del sistema digestivo del beb.  La leche tiene anticuerpos que ayudan a prevenir las infecciones en el beb.  El beb tiene una menor incidencia de asma, alergias y del sndrome de muerte sbita del lactante.  Los nutrientes en la leche materna son mejores para el beb que la leche maternizada y estn preparados exclusivamente para cubrir las necesidades del beb.  La leche materna mejora el desarrollo cerebral del beb.  Es menos probable que el beb desarrolle otras enfermedades, como obesidad  infantil, asma o diabetes mellitus de tipo 2. Para usted   La lactancia materna favorece el desarrollo de un vnculo muy especial entre la madre y el beb.  Es conveniente. La leche materna siempre est disponible a la temperatura correcta y es econmica.  La lactancia materna ayuda a quemar caloras y a perder el peso ganado durante el embarazo.  Favorece la contraccin del tero al tamao que tena antes del embarazo de manera ms rpida y disminuye el sangrado (loquios) despus del parto.  La lactancia materna contribuye a reducir el riesgo de desarrollar diabetes mellitus de tipo 2, osteoporosis o cncer de mama o de ovario en el futuro. SIGNOS DE QUE EL BEB EST HAMBRIENTO Primeros signos de hambre  Aumenta su estado de alerta o actividad.  Se estira.  Mueve la cabeza de un lado a otro.  Mueve la cabeza y abre la boca cuando se le toca la mejilla o la comisura de la boca (reflejo de bsqueda).  Aumenta las vocalizaciones, tales como sonidos de succin, se relame los labios, emite arrullos, suspiros, o chirridos.  Mueve la mano hacia la boca.  Se chupa con ganas los dedos o las manos. Signos tardos de hambre  Est agitado.  Llora de manera intermitente. Signos de hambre extrema Los signos de hambre extrema requerirn que lo calme y lo consuele antes de que el beb pueda alimentarse adecuadamente. No espere a que se manifiesten los siguientes signos de hambre extrema para comenzar a amamantar:   Agitacin.  Llanto intenso y fuerte.   Gritos. INFORMACIN BSICA SOBRE LA LACTANCIA MATERNA Iniciacin de la lactancia materna  Encuentre un lugar cmodo para sentarse o acostarse, con un buen respaldo para el cuello y la espalda.  Coloque una almohada o una manta enrollada debajo del beb para acomodarlo a la altura de la mama (si est sentada). Las almohadas para amamantar se han diseado especialmente a fin de servir de apoyo para los brazos y el beb mientras  amamanta.  Asegrese de que el abdomen del beb est frente al suyo.  Masajee suavemente la mama. Con las yemas de los dedos, masajee la pared del pecho hacia el pezn en un movimiento circular. Esto estimula el flujo de leche. Es posible que deba continuar este movimiento mientras amamanta si la leche fluye lentamente.  Sostenga la mama con el pulgar por arriba del pezn y los otros   4 dedos por debajo de la mama. Asegrese de que los dedos se encuentren lejos del pezn y de la boca del beb.  Empuje suavemente los labios del beb con el pezn o con el dedo.  Cuando la boca del beb se abra lo suficiente, acrquelo rpidamente a la mama e introduzca todo el pezn y la zona oscura que lo rodea (areola), tanto como sea posible, dentro de la boca del beb.  Debe haber ms areola visible por arriba del labio superior del beb que por debajo del labio inferior.  La lengua del beb debe estar entre la enca inferior y la mama.  Asegrese de que la boca del beb est en la posicin correcta alrededor del pezn (prendida). Los labios del beb deben crear un sello sobre la mama y estar doblados hacia afuera (invertidos).  Es comn que el beb succione durante 2 a 3 minutos para que comience el flujo de leche materna. Cmo debe prenderse Es muy importante que le ensee al beb cmo prenderse adecuadamente a la mama. Si el beb no se prende adecuadamente, puede causarle dolor en el pezn y reducir la produccin de leche materna, y hacer que el beb tenga un escaso aumento de peso. Adems, si el beb no se prende adecuadamente al pezn, puede tragar aire durante la alimentacin. Esto puede causarle molestias al beb. Hacer eructar al beb al cambiar de mama puede ayudarlo a liberar el aire. Sin embargo, ensearle al beb cmo prenderse a la mama adecuadamente es la mejor manera de evitar que se sienta molesto por tragar aire mientras se alimenta. Signos de que el beb se ha prendido adecuadamente al pezn:    Tironea o succiona de modo silencioso, sin causarle dolor.  Se escucha que traga cada 3 o 4 succiones.   Hay movimientos musculares por arriba y por delante de sus odos al succionar. Signos de que el beb no se ha prendido adecuadamente al pezn:   Hace ruidos de succin o de chasquido mientras se alimenta.  Siente dolor en el pezn. Si cree que el beb no se prendi correctamente, deslice el dedo en la comisura de la boca y colquelo entre las encas del beb para interrumpir la succin. Intente comenzar a amamantar nuevamente. Signos de lactancia materna exitosa Signos del beb:   Disminuye gradualmente el nmero de succiones o cesa la succin por completo.  Se duerme.  Relaja el cuerpo.  Retiene una pequea cantidad de leche en la boca.  Se desprende solo del pecho. Signos que presenta usted:  Las mamas han aumentado la firmeza, el peso y el tamao 1 a 3 horas despus de amamantar.  Estn ms blandas inmediatamente despus de amamantar.  Un aumento del volumen de leche, y tambin un cambio en su consistencia y color se producen hacia el quinto da de lactancia materna.  Los pezones no duelen, ni estn agrietados ni sangran. Signos de que su beb recibe la cantidad de leche suficiente  Moja al menos 3 paales en 24 horas. La orina debe ser clara y de color amarillo plido a los 5 das de vida.  Defeca al menos 3 veces en 24 horas a los 5 das de vida. La materia fecal debe ser blanda y amarillenta.  Defeca al menos 3 veces en 24 horas a los 7 das de vida. La materia fecal debe ser grumosa y amarillenta.  No registra una prdida de peso mayor del 10% del peso al nacer durante los primeros 3 das de vida.    Aumenta de peso un promedio de 4 a 7onzas (113 a 198g) por semana despus de los 4 das de vida.  Aumenta de peso, diariamente, de manera uniforme a partir de los 5 das de vida, sin registrar prdida de peso despus de las 2semanas de vida. Despus de  alimentarse, es posible que el beb regurgite una pequea cantidad. Esto es frecuente. FRECUENCIA Y DURACIN DE LA LACTANCIA MATERNA El amamantamiento frecuente la ayudar a producir ms leche y a prevenir problemas de dolor en los pezones e hinchazn en las mamas. Alimente al beb cuando muestre signos de hambre o si siente la necesidad de reducir la congestin de las mamas. Esto se denomina "lactancia a demanda". Evite el uso del chupete mientras trabaja para establecer la lactancia (las primeras 4 a 6 semanas despus del nacimiento del beb). Despus de este perodo, podr ofrecerle un chupete. Las investigaciones demostraron que el uso del chupete durante el primer ao de vida del beb disminuye el riesgo de desarrollar el sndrome de muerte sbita del lactante (SMSL). Permita que el nio se alimente en cada mama todo lo que desee. Contine amamantando al beb hasta que haya terminado de alimentarse. Cuando el beb se desprende o se queda dormido mientras se est alimentando de la primera mama, ofrzcale la segunda. Debido a que, con frecuencia, los recin nacidos permanecen somnolientos las primeras semanas de vida, es posible que deba despertar al beb para alimentarlo. Los horarios de lactancia varan de un beb a otro. Sin embargo, las siguientes reglas pueden servir como gua para ayudarla a garantizar que el beb se alimenta adecuadamente:  Se puede amamantar a los recin nacidos (bebs de 4 semanas o menos de vida) cada 1 a 3 horas.  No deben transcurrir ms de 3 horas durante el da o 5 horas durante la noche sin que se amamante a los recin nacidos.  Debe amamantar al beb 8 veces como mnimo en un perodo de 24 horas, hasta que comience a introducir slidos en su dieta, a los 6 meses de vida aproximadamente. EXTRACCIN DE LECHE MATERNA La extraccin y el almacenamiento de la leche materna le permiten asegurarse de que el beb se alimente exclusivamente de leche materna, aun en momentos en  los que no puede amamantar. Esto tiene especial importancia si debe regresar al trabajo en el perodo en que an est amamantando o si no puede estar presente en los momentos en que el beb debe alimentarse. Su asesor en lactancia puede orientarla sobre cunto tiempo es seguro almacenar leche materna.  El sacaleche es un aparato que le permite extraer leche de la mama a un recipiente estril. Luego, la leche materna extrada puede almacenarse en un refrigerador o congelador. Algunos sacaleches son manuales, mientras que otros son elctricos. Consulte a su asesor en lactancia qu tipo ser ms conveniente para usted. Los sacaleches se pueden comprar; sin embargo, algunos hospitales y grupos de apoyo a la lactancia materna alquilan sacaleches mensualmente. Un asesor en lactancia puede ensearle cmo extraer leche materna manualmente, en caso de que prefiera no usar un sacaleche.  CMO CUIDAR LAS MAMAS DURANTE LA LACTANCIA MATERNA Los pezones se secan, agrietan y duelen durante la lactancia materna. Las siguientes recomendaciones pueden ayudarla a mantener las mamas humectadas y sanas:  Evite usar jabn en los pezones.  Use un sostn de soporte. Aunque no son esenciales, las camisetas sin mangas o los sostenes especiales para amamantar estn diseados para acceder fcilmente a las mamas, para amamantar sin tener que quitarse todo   el sostn o la camiseta. Evite usar sostenes con aro o sostenes muy ajustados.  Seque al aire sus pezones durante 3 a 4minutos despus de amamantar al beb.  Utilice solo apsitos de algodn en el sostn para absorber las prdidas de leche. La prdida de un poco de leche materna entre las tomas es normal.  Utilice lanolina sobre los pezones luego de amamantar. La lanolina ayuda a mantener la humedad normal de la piel. Si usa lanolina pura, no tiene que lavarse los pezones antes de volver a alimentar al beb. La lanolina pura no es txica para el beb. Adems, puede extraer  manualmente algunas gotas de leche materna y masajear suavemente esa leche sobre los pezones, para que la leche se seque al aire. Durante las primeras semanas despus de dar a luz, algunas mujeres pueden experimentar hinchazn en las mamas (congestin mamaria). La congestin puede hacer que sienta las mamas pesadas, calientes y sensibles al tacto. El pico de la congestin ocurre dentro de los 3 a 5 das despus del parto. Las siguientes recomendaciones pueden ayudarla a aliviar la congestin:  Vace por completo las mamas al amamantar o extraer leche. Puede aplicar calor hmedo en las mamas (en la ducha o con toallas hmedas para manos) antes de amamantar o extraer leche. Esto aumenta la circulacin y ayuda a que la leche fluya. Si el beb no vaca por completo las mamas cuando lo amamanta, extraiga la leche restante despus de que haya finalizado.  Use un sostn ajustado (para amamantar o comn) o una camiseta sin mangas durante 1 o 2 das para indicar al cuerpo que disminuya ligeramente la produccin de leche.  Aplique compresas de hielo sobre las mamas, a menos que le resulte demasiado incmodo.  Asegrese de que el beb est prendido y se encuentre en la posicin correcta mientras lo alimenta. Si la congestin persiste luego de 48 horas o despus de seguir estas recomendaciones, comunquese con su mdico o un asesor en lactancia. RECOMENDACIONES GENERALES PARA EL CUIDADO DE LA SALUD DURANTE LA LACTANCIA MATERNA  Consuma alimentos saludables. Alterne comidas y colaciones, y coma 3 de cada una por da. Dado que lo que come afecta la leche materna, es posible que algunas comidas hagan que su beb se vuelva ms irritable de lo habitual. Evite comer este tipo de alimentos si percibe que afectan de manera negativa al beb.  Beba leche, jugos de fruta y agua para satisfacer su sed (aproximadamente 10 vasos al da).  Descanse con frecuencia, reljese y tome sus vitaminas prenatales para evitar la  fatiga, el estrs y la anemia.  Contine con los autocontroles de la mama.  Evite masticar y fumar tabaco.  Evite el consumo de alcohol y drogas. Algunos medicamentos, que pueden ser perjudiciales para el beb, pueden pasar a travs de la leche materna. Es importante que consulte a su mdico antes de tomar cualquier medicamento, incluidos todos los medicamentos recetados y de venta libre, as como los suplementos vitamnicos y herbales. Puede quedar embarazada durante la lactancia. Si desea controlar la natalidad, consulte a su mdico cules son las opciones ms seguras para el beb. SOLICITE ATENCIN MDICA SI:   Usted siente que quiere dejar de amamantar o se siente frustrada con la lactancia.  Siente dolor en las mamas o en los pezones.  Sus pezones estn agrietados o sangran.  Sus pechos estn irritados, sensibles o calientes.  Tiene un rea hinchada en cualquiera de las mamas.  Siente escalofros o fiebre.  Tiene nuseas o vmitos.    Presenta una secrecin de otro lquido distinto de la leche materna de los pezones.  Sus mamas no se llenan antes de amamantar al beb para el quinto da despus del parto.  Se siente triste y deprimida.  El beb est demasiado somnoliento como para comer bien.  El beb tiene problemas para dormir.  Moja menos de 3 paales en 24 horas.  Defeca menos de 3 veces en 24 horas.  La piel del beb o la parte blanca de los ojos se vuelven amarillentas.  El beb no ha aumentado de peso a los 5 das de vida. SOLICITE ATENCIN MDICA DE INMEDIATO SI:   El beb est muy cansado (letargo) y no se quiere despertar para comer.  Le sube la fiebre sin causa. Document Released: 10/27/2005 Document Revised: 11/01/2013 ExitCare Patient Information 2015 [employer]. This information is not intended to replace advice given to you by your health care provider. Make sure you discuss any questions you have with your health care provider.  

## 2014-11-21 LAB — GC/CHLAMYDIA PROBE AMP
CT Probe RNA: NEGATIVE
GC PROBE AMP APTIMA: NEGATIVE

## 2014-11-22 LAB — POCT URINALYSIS DIP (DEVICE)
Bilirubin Urine: NEGATIVE
Glucose, UA: NEGATIVE mg/dL
KETONES UR: NEGATIVE mg/dL
LEUKOCYTES UA: NEGATIVE
Nitrite: NEGATIVE
Protein, ur: 30 mg/dL — AB
Specific Gravity, Urine: 1.02 (ref 1.005–1.030)
Urobilinogen, UA: 1 mg/dL (ref 0.0–1.0)
pH: 7 (ref 5.0–8.0)

## 2014-11-22 LAB — CULTURE, BETA STREP (GROUP B ONLY)

## 2014-11-23 ENCOUNTER — Other Ambulatory Visit: Payer: Self-pay

## 2014-11-27 ENCOUNTER — Ambulatory Visit (INDEPENDENT_AMBULATORY_CARE_PROVIDER_SITE_OTHER): Payer: Self-pay | Admitting: Obstetrics & Gynecology

## 2014-11-27 VITALS — BP 105/61 | HR 61 | Wt 150.3 lb

## 2014-11-27 DIAGNOSIS — O0933 Supervision of pregnancy with insufficient antenatal care, third trimester: Secondary | ICD-10-CM

## 2014-11-27 DIAGNOSIS — O09523 Supervision of elderly multigravida, third trimester: Secondary | ICD-10-CM

## 2014-11-27 DIAGNOSIS — Z550 Illiteracy and low-level literacy: Secondary | ICD-10-CM

## 2014-11-27 DIAGNOSIS — O24419 Gestational diabetes mellitus in pregnancy, unspecified control: Secondary | ICD-10-CM

## 2014-11-27 LAB — POCT URINALYSIS DIP (DEVICE)
Bilirubin Urine: NEGATIVE
Glucose, UA: NEGATIVE mg/dL
Ketones, ur: NEGATIVE mg/dL
Nitrite: NEGATIVE
Protein, ur: NEGATIVE mg/dL
Specific Gravity, Urine: 1.025 (ref 1.005–1.030)
Urobilinogen, UA: 1 mg/dL (ref 0.0–1.0)
pH: 7 (ref 5.0–8.0)

## 2014-11-27 LAB — US OB FOLLOW UP

## 2014-11-27 NOTE — Progress Notes (Signed)
Interpreter - Colleen Thomas present for encounter.  Pt reports back pain x2 days.  US for growth scheduled 1/25.  IOL scheduled 2/1 @ 0730

## 2014-11-27 NOTE — Patient Instructions (Signed)
Regrese a la clinica cuando tenga su cita. Si tiene problemas o preguntas, llama a la clinica o vaya a la sal[facility] al [facility].    

## 2014-11-27 NOTE — Progress Notes (Signed)
Patient is Spanish-speaking only, Spanish interpreter present for this encounter. Patient has not been checking blood sugars with any kind of regular frequency; random throughout the day.  Her father was hospitalized for a heart attack and she has been his support person.  Unable to check BS as needed.   Empathized with patient but emphasized that no medication change can be carried out with erratic blood sugar readings; risks of noncompliance discussed. Patient will be induced in about 2 weeks, this will be scheduled NST performed today was reviewed and was found to be reactive.  AFI normal at 14.8 cm.  Continue recommended antenatal testing and prenatal care. No other complaints or concerns.  Labor and fetal movement precautions reviewed.

## 2014-11-30 ENCOUNTER — Other Ambulatory Visit: Payer: Self-pay

## 2014-12-04 ENCOUNTER — Ambulatory Visit (INDEPENDENT_AMBULATORY_CARE_PROVIDER_SITE_OTHER): Payer: Self-pay | Admitting: Obstetrics & Gynecology

## 2014-12-04 ENCOUNTER — Ambulatory Visit (HOSPITAL_COMMUNITY)
Admission: RE | Admit: 2014-12-04 | Discharge: 2014-12-04 | Disposition: A | Discharge status: Home / Self Care | Payer: Self-pay | Source: Ambulatory Visit | Attending: Obstetrics & Gynecology | Admitting: Obstetrics & Gynecology

## 2014-12-04 VITALS — BP 103/66 | HR 65 | Temp 97.4°F | Wt 153.0 lb

## 2014-12-04 DIAGNOSIS — O0933 Supervision of pregnancy with insufficient antenatal care, third trimester: Secondary | ICD-10-CM

## 2014-12-04 DIAGNOSIS — O24419 Gestational diabetes mellitus in pregnancy, unspecified control: Secondary | ICD-10-CM | POA: Insufficient documentation

## 2014-12-04 DIAGNOSIS — Z3A38 38 weeks gestation of pregnancy: Secondary | ICD-10-CM | POA: Insufficient documentation

## 2014-12-04 DIAGNOSIS — O283 Abnormal ultrasonic finding on antenatal screening of mother: Secondary | ICD-10-CM

## 2014-12-04 LAB — POCT URINALYSIS DIP (DEVICE)
Bilirubin Urine: NEGATIVE
GLUCOSE, UA: NEGATIVE mg/dL
Hgb urine dipstick: NEGATIVE
Ketones, ur: NEGATIVE mg/dL
Nitrite: POSITIVE — AB
PH: 7 (ref 5.0–8.0)
Protein, ur: NEGATIVE mg/dL
SPECIFIC GRAVITY, URINE: 1.015 (ref 1.005–1.030)
Urobilinogen, UA: 0.2 mg/dL (ref 0.0–1.0)

## 2014-12-04 NOTE — Progress Notes (Signed)
Carol Hernandez used as interpreter for this encounter.  

## 2014-12-04 NOTE — Progress Notes (Signed)
Patient is Spanish-speaking only, Spanish interpreter present for this encounter. The blood sugars recorded are within range. IOL scheduled on 12/11/2013 at 39 weeks. UA showed nitrates, denies UTI symptoms. Urine culture sent, will follow up results and manage accordingly. NST performed today was reviewed and was found to be reactive.  Continue recommended antenatal testing and prenatal care. Growth scan today, will follow up results and manage accordingly. No other complaints or concerns.  Labor and fetal movement precautions reviewed.

## 2014-12-04 NOTE — Patient Instructions (Signed)
Induccin del trabajo de parto  (Labor Induction ) Se denomina induccin del trabajo de parto cuando se inician acciones para hacer que una mujer embarazada comience el trabajo de parto. La mayora de las mujeres comienzan el trabajo de parto sin ayuda entre las semanas 37 y 42 del embarazo. Cuando esto no ocurre o cuando hay una necesidad mdica, pueden utilizarse diferentes mtodos para inducirlo. La induccin del trabajo de parto hace que el tero se contraiga. Tambin hace que el cuello del tero se ablandemadure), se abra (se dilate), y se afine (se borre). Generalmente el trabajo de parto no se induce antes de las 39 semanas excepto que haya un problema con el beb o con la madre.  Antes de inducir el trabajo de parto, el mdico considerar cierto nmero de factores incluyendo los siguientes:  El estado del beb.  Cuntas semanas tiene de embarazo.  La madurez de los pulmones del beb.  El estado del cuello del tero.  La posicin del beb. CULES SON LOS MOTIVOS PARA INDUCIR UN PARTO? El trabajo de parto puede inducirse por las siguientes razones:  La salud del beb o de la madre estn en riesgo.  El embarazo se ha pasado de trmino en 1 semana o ms.  Ha roto la bolsa de aguas pero no se ha iniciado el trabajo de parto por s mismo.  La madre tiene algn trastorno de salud o una enfermedad grave, como hipertensin arterial, una infeccin, desprendimiento abrupto de la placenta o diabetes.  Hay escaso lquido amnitico alrededor del beb.  El beb presenta sufrimiento. La conveniencia o el deseo de que el beb nazca en una cierta fecha no es un motivo para inducir el parto. CULES SON LOS MTODOS UTILIZADOS PARA INDUCIR EL TRABAJO DE PARTO? Algunos mtodos de induccin del trabajo de parto son:   Administracin del medicamentos prostaglandina. Este medicamento hace que el cuello uterino se dilate y madure. Este medicamento tambin iniciar las contracciones. Puede tomarse por  boca o insertarse en la vagina en forma de supositorio.  Insercin en la vagina de un tubo delgado (catter) con un baln en el extremo para dilatar el cuello del tero. Una vez insertado, el baln se infla con agua, lo que provoca la apertura del cuello del tero.  Ruptura de las membranas. El mdico separa el saco amnitico del cuello uterino, haciendo que el cuello uterino se distienda y cause la liberacin de la hormona llamada progesterona. Esto hace que el tero se contraiga. Este procedimiento se realiza durante una visita al consultorio mdico. Le indicarn que vuelva a su casa y espere que se inicien las contracciones. Luego tendr que volver para la induccin.  Ruptura de la bolsa de aguas. El mdico romper el saco amnitico con un pequeo instrumento. Una vez que el saco amnitico se rompe, las contracciones deben comenzar. Pueden pasar algunas horas hasta que haga efecto.  Medicamentos que desencadenen o intensifiquen las contracciones. Se lo administrarn a travs de un catter por va intravenosa (IV) que se inserta en una de las venas del brazo. Todos los mtodos de induccin, excepto la ruptura de membranas, se realizan en el hospital. La induccin se realizar en el hospital, de modo que usted y el beb puedan ser controlados cuidadosamente.  CUNTO TIEMPO LLEVA INDUCIR EL TRABAJO DE PARTO? Algunas inducciones pueden demorar entre 2 y 3 das. Generalmente lleva menos tiempo, dependiendo del estado del cuello del tero. Puede tomar ms tiempo si la induccin se realiza en etapas tempranas del embarazo   o es su primer embarazo. Si han pasado 2 o 3 das y no se inicia el trabajo de parto, podrn enviarla a su casa o realizar una cesrea. CULES SON LOS RIESGOS ASOCIADOS CON LA INDUCCiN DEL TRABAJO DE PARTO? Algunos de los riesgos de la induccin son:   Cambios en la frecuencia cardaca fetal, por ejemplo los latidos son demasiado rpidos, o lentos, o errticos.  Riesgo de distrs  fetal.  Posibilidad de infeccin en la madre o el beb.  Aumento de la posibilidad de que sea necesaria una cesrea.  Ruptura (abrupcin) de la placenta del tero (raro).  Ruptura uterina (muy raro). Cuando es necesario realizar la induccin por razones mdicas, los beneficios deben superar a los riesgos. CULES SON ALGUNAS RAZONES PARA NO INDUCIR EL TRABAJO DE PARTO? La induccin no debe realizarse si:   Se demuestra que el beb no tolera el trabajo de parto.  Fue sometida anteriormente a cirugas en el tero, como una miomectoma o le han extirpado fibromas.  La placenta est en una posicin muy baja en el tero y obstruye la abertura del cuello (placenta previa).  El beb no est ubicado con la cabeza hacia bajo.  El cordn umbilical cae hacia el canal de parto, adelante del beb. Esto puede cortar el suministro de sangre y oxgeno al beb.  Fue sometida a una cesrea anteriormente.  Hay circunstancias poco habituales, como que el beb es extremadamente prematuro. Document Released: 02/03/2008 Document Revised: 06/29/2013 ExitCare Patient Information 2015 [employer]. This information is not intended to replace advice given to you by your health care provider. Make sure you discuss any questions you have with your health care provider.  

## 2014-12-05 ENCOUNTER — Telehealth (HOSPITAL_COMMUNITY): Payer: Self-pay | Admitting: *Deleted

## 2014-12-05 ENCOUNTER — Encounter (HOSPITAL_COMMUNITY): Payer: Self-pay | Admitting: *Deleted

## 2014-12-05 NOTE — Telephone Encounter (Signed)
Preadmission screen  

## 2014-12-05 NOTE — Telephone Encounter (Signed)
Preadmission screen interpreter [phone]

## 2014-12-06 LAB — CULTURE, OB URINE: Colony Count: 8000

## 2014-12-08 ENCOUNTER — Ambulatory Visit (INDEPENDENT_AMBULATORY_CARE_PROVIDER_SITE_OTHER): Payer: Self-pay | Admitting: Obstetrics & Gynecology

## 2014-12-08 ENCOUNTER — Encounter (HOSPITAL_COMMUNITY): Payer: Self-pay | Admitting: *Deleted

## 2014-12-08 ENCOUNTER — Inpatient Hospital Stay (HOSPITAL_COMMUNITY): Payer: Medicaid Other | Admitting: Anesthesiology

## 2014-12-08 ENCOUNTER — Inpatient Hospital Stay (HOSPITAL_COMMUNITY)
Admission: AD | Admit: 2014-12-08 | Discharge: 2014-12-09 | DRG: 775 | Disposition: A | Discharge status: Home / Self Care | Payer: Medicaid Other | Source: Ambulatory Visit | Attending: Obstetrics and Gynecology | Admitting: Obstetrics and Gynecology

## 2014-12-08 VITALS — BP 115/76 | HR 76

## 2014-12-08 DIAGNOSIS — O09523 Supervision of elderly multigravida, third trimester: Secondary | ICD-10-CM | POA: Diagnosis not present

## 2014-12-08 DIAGNOSIS — O24429 Gestational diabetes mellitus in childbirth, unspecified control: Secondary | ICD-10-CM | POA: Diagnosis present

## 2014-12-08 DIAGNOSIS — O24419 Gestational diabetes mellitus in pregnancy, unspecified control: Secondary | ICD-10-CM

## 2014-12-08 DIAGNOSIS — Z79899 Other long term (current) drug therapy: Secondary | ICD-10-CM

## 2014-12-08 DIAGNOSIS — IMO0001 Reserved for inherently not codable concepts without codable children: Secondary | ICD-10-CM

## 2014-12-08 DIAGNOSIS — Z3A38 38 weeks gestation of pregnancy: Secondary | ICD-10-CM | POA: Diagnosis present

## 2014-12-08 LAB — CBC
HEMATOCRIT: 36.1 % (ref 36.0–46.0)
HEMOGLOBIN: 11.6 g/dL — AB (ref 12.0–15.0)
MCH: 27.7 pg (ref 26.0–34.0)
MCHC: 32.1 g/dL (ref 30.0–36.0)
MCV: 86.2 fL (ref 78.0–100.0)
Platelets: 259 10*3/uL (ref 150–400)
RBC: 4.19 MIL/uL (ref 3.87–5.11)
RDW: 15.5 % (ref 11.5–15.5)
WBC: 7.8 10*3/uL (ref 4.0–10.5)

## 2014-12-08 LAB — HEPATITIS B SURFACE ANTIGEN: Hepatitis B Surface Ag: NEGATIVE

## 2014-12-08 LAB — ABO/RH: ABO/RH(D): O POS

## 2014-12-08 LAB — TYPE AND SCREEN
ABO/RH(D): O POS
ANTIBODY SCREEN: NEGATIVE

## 2014-12-08 MED ORDER — PHENYLEPHRINE 40 MCG/ML (10ML) SYRINGE FOR IV PUSH (FOR BLOOD PRESSURE SUPPORT)
80.0000 ug | PREFILLED_SYRINGE | INTRAVENOUS | Status: DC | PRN
  Filled 2014-12-08: qty 20

## 2014-12-08 MED ORDER — ONDANSETRON HCL 4 MG PO TABS: 4.0000 mg | ORAL_TABLET | ORAL | Status: DC | PRN

## 2014-12-08 MED ORDER — LIDOCAINE HCL (PF) 1 % IJ SOLN: 30.0000 mL | INTRAMUSCULAR | Status: DC | PRN

## 2014-12-08 MED ORDER — SODIUM CHLORIDE 0.9 % IV SOLN: 250.0000 mL | INTRAVENOUS | Status: DC | PRN

## 2014-12-08 MED ORDER — ZOLPIDEM TARTRATE 5 MG PO TABS: 5.0000 mg | ORAL_TABLET | Freq: Every evening | ORAL | Status: DC | PRN

## 2014-12-08 MED ORDER — PHENYLEPHRINE 40 MCG/ML (10ML) SYRINGE FOR IV PUSH (FOR BLOOD PRESSURE SUPPORT): 80.0000 ug | PREFILLED_SYRINGE | INTRAVENOUS | Status: DC | PRN

## 2014-12-08 MED ORDER — ONDANSETRON HCL 4 MG/2ML IJ SOLN: 4.0000 mg | INTRAMUSCULAR | Status: DC | PRN

## 2014-12-08 MED ORDER — MISOPROSTOL 200 MCG PO TABS
1000.0000 ug | ORAL_TABLET | Freq: Once | ORAL | Status: AC
  Administered 2014-12-08: 1000 ug via RECTAL

## 2014-12-08 MED ORDER — OXYCODONE-ACETAMINOPHEN 5-325 MG PO TABS: 1.0000 | ORAL_TABLET | ORAL | Status: DC | PRN

## 2014-12-08 MED ORDER — MISOPROSTOL 200 MCG PO TABS
ORAL_TABLET | ORAL | Status: AC
  Filled 2014-12-08: qty 5

## 2014-12-08 MED ORDER — LACTATED RINGERS IV SOLN: 500.0000 mL | INTRAVENOUS | Status: DC | PRN

## 2014-12-08 MED ORDER — WITCH HAZEL-GLYCERIN EX PADS
1.0000 | MEDICATED_PAD | CUTANEOUS | Status: DC | PRN
Start: 2014-12-08 — End: 2014-12-09

## 2014-12-08 MED ORDER — ONDANSETRON HCL 4 MG/2ML IJ SOLN: 4.0000 mg | Freq: Four times a day (QID) | INTRAMUSCULAR | Status: DC | PRN

## 2014-12-08 MED ORDER — IBUPROFEN 600 MG PO TABS
600.0000 mg | ORAL_TABLET | Freq: Four times a day (QID) | ORAL | Status: DC
  Administered 2014-12-08 – 2014-12-09 (×4): 600 mg via ORAL
  Filled 2014-12-08 (×4): qty 1

## 2014-12-08 MED ORDER — OXYTOCIN 40 UNITS IN LACTATED RINGERS INFUSION - SIMPLE MED: 62.5000 mL/h | INTRAVENOUS | Status: DC | PRN

## 2014-12-08 MED ORDER — LIDOCAINE HCL (PF) 1 % IJ SOLN
INTRAMUSCULAR | Status: DC | PRN
  Administered 2014-12-08 (×2): 4 mL

## 2014-12-08 MED ORDER — SIMETHICONE 80 MG PO CHEW: 80.0000 mg | CHEWABLE_TABLET | ORAL | Status: DC | PRN

## 2014-12-08 MED ORDER — DIBUCAINE 1 % RE OINT
1.0000 "application " | TOPICAL_OINTMENT | RECTAL | Status: DC | PRN
  Filled 2014-12-08: qty 28

## 2014-12-08 MED ORDER — ACETAMINOPHEN 325 MG PO TABS: 650.0000 mg | ORAL_TABLET | ORAL | Status: DC | PRN

## 2014-12-08 MED ORDER — EPHEDRINE 5 MG/ML INJ: 10.0000 mg | INTRAVENOUS | Status: DC | PRN

## 2014-12-08 MED ORDER — FENTANYL 2.5 MCG/ML BUPIVACAINE 1/10 % EPIDURAL INFUSION (WH - ANES)
INTRAMUSCULAR | Status: DC | PRN
Start: 2014-12-08 — End: 2014-12-08
  Administered 2014-12-08: 12 mL/h via EPIDURAL

## 2014-12-08 MED ORDER — SENNOSIDES-DOCUSATE SODIUM 8.6-50 MG PO TABS
2.0000 | ORAL_TABLET | ORAL | Status: DC
  Administered 2014-12-08: 2 via ORAL
  Filled 2014-12-08: qty 2

## 2014-12-08 MED ORDER — LANOLIN HYDROUS EX OINT: TOPICAL_OINTMENT | CUTANEOUS | Status: DC | PRN

## 2014-12-08 MED ORDER — DIPHENHYDRAMINE HCL 50 MG/ML IJ SOLN: 12.5000 mg | INTRAMUSCULAR | Status: DC | PRN

## 2014-12-08 MED ORDER — CITRIC ACID-SODIUM CITRATE 334-500 MG/5ML PO SOLN: 30.0000 mL | ORAL | Status: DC | PRN

## 2014-12-08 MED ORDER — PRENATAL MULTIVITAMIN CH
1.0000 | ORAL_TABLET | Freq: Every day | ORAL | Status: DC
  Administered 2014-12-09: 1 via ORAL
  Filled 2014-12-08: qty 1

## 2014-12-08 MED ORDER — OXYTOCIN BOLUS FROM INFUSION: 500.0000 mL | INTRAVENOUS | Status: DC

## 2014-12-08 MED ORDER — OXYCODONE-ACETAMINOPHEN 5-325 MG PO TABS: 2.0000 | ORAL_TABLET | ORAL | Status: DC | PRN

## 2014-12-08 MED ORDER — SODIUM CHLORIDE 0.9 % IJ SOLN: 3.0000 mL | Freq: Two times a day (BID) | INTRAMUSCULAR | Status: DC

## 2014-12-08 MED ORDER — BISACODYL 10 MG RE SUPP
10.0000 mg | Freq: Every day | RECTAL | Status: DC | PRN
  Filled 2014-12-08: qty 1

## 2014-12-08 MED ORDER — FLEET ENEMA 7-19 GM/118ML RE ENEM: 1.0000 | ENEMA | Freq: Every day | RECTAL | Status: DC | PRN

## 2014-12-08 MED ORDER — LACTATED RINGERS IV SOLN: INTRAVENOUS | Status: DC

## 2014-12-08 MED ORDER — BENZOCAINE-MENTHOL 20-0.5 % EX AERO
1.0000 "application " | INHALATION_SPRAY | CUTANEOUS | Status: DC | PRN
  Filled 2014-12-08: qty 56

## 2014-12-08 MED ORDER — SODIUM CHLORIDE 0.9 % IJ SOLN: 3.0000 mL | INTRAMUSCULAR | Status: DC | PRN

## 2014-12-08 MED ORDER — ACETAMINOPHEN 500 MG PO TABS
1000.0000 mg | ORAL_TABLET | Freq: Four times a day (QID) | ORAL | Status: DC | PRN
  Administered 2014-12-08 – 2014-12-09 (×2): 1000 mg via ORAL
  Filled 2014-12-08 (×2): qty 2

## 2014-12-08 MED ORDER — OXYTOCIN 40 UNITS IN LACTATED RINGERS INFUSION - SIMPLE MED
62.5000 mL/h | INTRAVENOUS | Status: DC
  Administered 2014-12-08: 999 mL/h via INTRAVENOUS
  Filled 2014-12-08: qty 1000

## 2014-12-08 MED ORDER — FENTANYL 2.5 MCG/ML BUPIVACAINE 1/10 % EPIDURAL INFUSION (WH - ANES)
14.0000 mL/h | INTRAMUSCULAR | Status: DC | PRN
  Filled 2014-12-08: qty 125

## 2014-12-08 MED ORDER — DIPHENHYDRAMINE HCL 25 MG PO CAPS: 25.0000 mg | ORAL_CAPSULE | Freq: Four times a day (QID) | ORAL | Status: DC | PRN

## 2014-12-08 MED ORDER — LACTATED RINGERS IV SOLN: 500.0000 mL | Freq: Once | INTRAVENOUS | Status: DC

## 2014-12-08 NOTE — H&P (Signed)
Colleen Thomas is a 34 y.o. female [id number] @[redacted]w[redacted]Thomas  presenting for active labor.  History   [facility]  Dating 2nd trimester US Blood type: O/Positive/-- (09/08 0000)  [facility] Antibody:Negative (09/08 0000)  Anatomic US Echogenic bowel - resolved with f/u Rubella: Immune (09/08 0000)  GTT GDM RPR: Nonreactive (11/18 0000)   TDaP vaccine 09/27/14 HBsAg:  NEGATIVE  Flu vaccine  10/09/14 HIV: NONREACTIVE (11/30 1345)   GBS neg GBS: NEGATIVE  Contraception OCP or depo Pap:  Baby Food Breast and bottle   Circumcision Girl   Pediatrician [facility]   Support Person Colleen Thomas     OB History    Gravida Para Term Preterm AB TAB SAB Ectopic Multiple Living   5 4 4       4      Past Medical History  Diagnosis Date  . AMA (advanced maternal age) multigravida 35+   . Gestational diabetes     glyburide   Past Surgical History  Procedure Laterality Date  . Cholecystectomy     Family History: family history is negative for Alcohol abuse, Arthritis, Asthma, Birth defects, Cancer, COPD, Depression, Diabetes, Drug abuse, Early death, Hearing loss, Heart disease, Hyperlipidemia, Hypertension, Kidney disease, Learning disabilities, Mental illness, Mental retardation, Miscarriages / Stillbirths, Stroke, Vision loss, and Varicose Veins. Social History:  reports that she has never smoked. She has never used smokeless tobacco. She reports that she does not drink alcohol or use illicit drugs.   Prenatal Transfer Tool  Maternal Diabetes: Yes:  Diabetes Type:  Diet controlled Genetic Screening: Declined Maternal Ultrasounds/Referrals: Normal Fetal Ultrasounds or other Referrals:  None Maternal Substance Abuse:  No Significant Maternal Medications:  None Significant Maternal Lab Results:  Lab values include: Group B Strep negative Other Comments:  None  ROS  Dilation: 8 Effacement (%): 80 Station: -1 Exam by:: Dr  Colleen Thomas Blood pressure 128/72, pulse 62, resp. rate 18, height 4\' 11"  (1.499 m), weight 69.4 kg (153 lb), SpO2 98 %. Exam Physical Exam  Prenatal labs: ABO, Rh: O/Positive/-- (09/08 0000) Antibody: Negative (09/08 0000) Rubella: Immune (09/08 0000) RPR: Nonreactive (11/18 0000)  HBsAg:    HIV: NONREACTIVE (11/30 1345)  GBS: Negative (01/11 0000)   Assessment: 1. Labor: active 2. Fetal Wellbeing: Category 1  3. Pain Control: epidural 4. GBS: neg 5. 38.4 week IUP  Plan:  1. Admit to BS per consult with MD 2. Routine L&Thomas orders 3. Analgesia/anesthesia PRN       Colleen Thomas, Colleen Thomas 12/08/2014, 11:54 AM    OB fellow attestation:  I have seen and examined this patient; I agree with above documentation in the resident's note.   Colleen Thomas is a 34 y.o. [id number] here for in active labor, direct admit from clinic where she was found to be dilated to 8cm  PE: BP 110/65 mmHg  Pulse 70  Temp(Src) 97.9 F (36.6 C) (Oral)  Resp 18  Ht 4\' 11"  (1.499 m)  Wt 153 lb (69.4 kg)  BMI 30.89 kg/m2  SpO2 98% Gen: calm comfortable, NAD Resp: normal effort, no distress Abd: gravid  ROS, labs, PMH reviewed  Plan: MOF: breast Pain: epidural Contraception: OCPs  Colleen Thomas Colleen Thomas 12/08/2014, 3:35 PM

## 2014-12-08 NOTE — Progress Notes (Signed)
Interpreter - Colleen Thomas present for encounter.  Pt reports having UC's since 0600 which have become stronger and closer together. Cx exam per Dr. Erin FullingHarraway-Smith.

## 2014-12-08 NOTE — Anesthesia Procedure Notes (Signed)
Epidural Patient location during procedure: OB Start time: 12/08/2014 10:35 AM End time: 12/08/2014 10:42 AM  Staffing Anesthesiologist: Felipe DroneJUDD, Farley Crooker JENNETTE Performed by: anesthesiologist   Preanesthetic Checklist Completed: patient identified, site marked, surgical consent, pre-op evaluation, timeout performed, IV checked, risks and benefits discussed and monitors and equipment checked  Epidural Patient position: sitting Prep: site prepped and draped and DuraPrep Patient monitoring: continuous pulse ox and blood pressure Approach: midline Location: L3-L4 Injection technique: LOR saline  Needle:  Needle type: Tuohy  Needle gauge: 17 G Needle length: 9 cm and 9 Needle insertion depth: 6 cm Catheter type: closed end flexible Catheter size: 19 Gauge Catheter at skin depth: 10 cm Test dose: negative  Assessment Events: blood not aspirated, injection not painful, no injection resistance, negative IV test and no paresthesia  Additional Notes Patient identified. Risks/Benefits/Options discussed with patient including but not limited to bleeding, infection, nerve damage, paralysis, failed block, incomplete pain control, headache, blood pressure changes, nausea, vomiting, reactions to medication both or allergic, itching and postpartum back pain. Confirmed with bedside nurse the patient's most recent platelet count. Confirmed with patient that they are not currently taking any anticoagulation, have any bleeding history or any family history of bleeding disorders. Patient expressed understanding and wished to proceed. All questions were answered. Sterile technique was used throughout the entire procedure. Please see nursing notes for vital signs. Test dose was given through epidural catheter and negative prior to continuing to dose epidural or start infusion. Warning signs of high block given to the patient including shortness of breath, tingling/numbness in hands, complete motor block, or any  concerning symptoms with instructions to call for help. Patient was given instructions on fall risk and not to get out of bed. All questions and concerns addressed with instructions to call with any issues or inadequate analgesia.

## 2014-12-08 NOTE — Progress Notes (Signed)
Pt c/o reg ctx since 0600.  Denies LOF or VB. Cervix 8cm/90% with a bulging BOW. To L&D.  Anticipate SVD. NST reviewed and reactive.  Sudeep Scheibel L. Harraway-Smith, M.D., Evern CoreFACOG

## 2014-12-08 NOTE — Progress Notes (Signed)
I was present during the Epidural with Dr. Judd.  Colleen Thomas  Interpreter. °

## 2014-12-08 NOTE — Anesthesia Preprocedure Evaluation (Signed)
Anesthesia Evaluation  Patient identified by MRN, date of birth, ID band Patient awake    Reviewed: Allergy & Precautions, NPO status , Patient's Chart, lab work & pertinent test results  History of Anesthesia Complications Negative for: history of anesthetic complications  Airway Mallampati: III  TM Distance: >3 FB Neck ROM: Full    Dental no notable dental hx. (+) Dental Advisory Given   Pulmonary neg pulmonary ROS,  breath sounds clear to auscultation  Pulmonary exam normal       Cardiovascular negative cardio ROS  Rhythm:Regular Rate:Normal     Neuro/Psych negative neurological ROS  negative psych ROS   GI/Hepatic negative GI ROS, Neg liver ROS,   Endo/Other  diabetes, Gestational  Renal/GU negative Renal ROS  negative genitourinary   Musculoskeletal negative musculoskeletal ROS (+)   Abdominal   Peds negative pediatric ROS (+)  Hematology negative hematology ROS (+)   Anesthesia Other Findings   Reproductive/Obstetrics (+) Pregnancy                             Anesthesia Physical Anesthesia Plan  ASA: II  Anesthesia Plan: Epidural   Post-op Pain Management:    Induction:   Airway Management Planned:   Additional Equipment:   Intra-op Plan:   Post-operative Plan:   Informed Consent: I have reviewed the patients History and Physical, chart, labs and discussed the procedure including the risks, benefits and alternatives for the proposed anesthesia with the patient or authorized representative who has indicated his/her understanding and acceptance.   Dental advisory given  Plan Discussed with:   Anesthesia Plan Comments:         Anesthesia Quick Evaluation

## 2014-12-09 NOTE — Discharge Summary (Signed)
Obstetric Discharge Summary Reason for Admission: onset of labor Prenatal Procedures: NST and ultrasound Intrapartum Procedures: spontaneous vaginal delivery Postpartum Procedures: none Complications-Operative and Postpartum: none HEMOGLOBIN  Date Value Ref Range Status  12/08/2014 11.6* 12.0 - 15.0 g/dL Final  40/98/119109/06/2014 47.811.5 g/dL Final   HCT  Date Value Ref Range Status  12/08/2014 36.1 36.0 - 46.0 % Final  07/18/2014 36 % Final   Patient is 34 y.o. [id number] admitted in active labor from clinic, hx of A2DM currently on glyburide, Echogenic bowel noted on sono   Delivery Note At 1:26 PM a viable female was delivered via Vaginal, Spontaneous Delivery (Presentation: ; Occiput Anterior). APGAR: 9, 9; weight pending .  Placenta status: Intact, Spontaneous. Cord: 3 vessels with the following complications: None.  Lower uterine segment initially very boggy => 1000mcg cytotec given PR, bladder drained via in and out cath  Anesthesia: Epidural  Episiotomy: None Lacerations: None Suture Repair: n/a Est. Blood Loss (mL): 350  Mom to postpartum. Baby to Couplet care / Skin to Skin.   HPI:  Reports doing well today, has some abdominal pain, vaginal swelling, feels she is bleeding heavily when she urinates.  Physical Exam:  General: alert and cooperative Lochia: appropriate Uterine Fundus: firm, no increased bleeding with aggressive massage DVT Evaluation: No evidence of DVT seen on physical exam. BP 109/62 mmHg  Pulse 56  Temp(Src) 98.3 F (36.8 C) (Oral)  Resp 18  Ht 4\' 11"  (1.499 m)  Wt 153 lb (69.4 kg)  BMI 30.89 kg/m2  SpO2 98%  Breastfeeding? Unknown  Discharge Diagnoses: Term Pregnancy-delivered  Discharge Information: Date: 12/09/2014 Activity: pelvic rest Diet: routine Medications: None Condition: stable Instructions: refer to practice specific booklet Discharge to: home Follow-up Information    [facility]    Specialty:  Obstetrics and Gynecology   Contact information:   [address] [place] [id number] [phone]      Newborn Data: Live born female  Birth Weight: 7 lb 12.9 oz (3540 g) APGAR: 9, 9  Home with mother.  Colleen Thomas ROCIO 12/09/2014, 7:14 AM

## 2014-12-09 NOTE — Progress Notes (Signed)
I assisted Tia RN with some questions, by Orlan LeavensViria Alvarez , Spanish Interpreter

## 2014-12-09 NOTE — Anesthesia Postprocedure Evaluation (Signed)
  Anesthesia Post-op Note Anesthesia Post Note  Patient: Colleen ParishMaria J Perez Thomas  Procedure(s) Performed: * No procedures listed *  Anesthesia type: Epidural  Patient location: Mother/Baby  Post pain: Pain level controlled  Post assessment: Post-op Vital signs reviewed  Last Vitals:  Filed Vitals:   12/09/14 0507  BP: 109/62  Pulse: 56  Temp: 36.8 C  Resp: 18    Post vital signs: Reviewed  Level of consciousness:alert  Complications: No apparent anesthesia complications

## 2014-12-09 NOTE — Discharge Instructions (Signed)

## 2014-12-11 ENCOUNTER — Encounter: Payer: Self-pay | Admitting: Obstetrics & Gynecology

## 2014-12-11 ENCOUNTER — Inpatient Hospital Stay (HOSPITAL_COMMUNITY): Admission: RE | Admit: 2014-12-11 | Payer: Self-pay | Source: Ambulatory Visit

## 2014-12-11 LAB — RPR: RPR Ser Ql: NONREACTIVE

## 2014-12-11 LAB — HIV ANTIBODY (ROUTINE TESTING W REFLEX): HIV Screen 4th Generation wRfx: NONREACTIVE

## 2014-12-11 NOTE — Progress Notes (Signed)
Ur chart review completed.  

## 2015-01-11 ENCOUNTER — Ambulatory Visit: Payer: Self-pay | Admitting: Advanced Practice Midwife

## 2015-06-07 ENCOUNTER — Emergency Department: Payer: Medicaid Other

## 2015-06-07 ENCOUNTER — Emergency Department
Admission: EM | Admit: 2015-06-07 | Discharge: 2015-06-07 | Disposition: A | Discharge status: Home / Self Care | Payer: Self-pay | Attending: Emergency Medicine | Admitting: Emergency Medicine

## 2015-06-07 ENCOUNTER — Encounter: Payer: Self-pay | Admitting: Emergency Medicine

## 2015-06-07 DIAGNOSIS — R1011 Right upper quadrant pain: Secondary | ICD-10-CM | POA: Insufficient documentation

## 2015-06-07 DIAGNOSIS — Z3202 Encounter for pregnancy test, result negative: Secondary | ICD-10-CM | POA: Insufficient documentation

## 2015-06-07 DIAGNOSIS — Z79899 Other long term (current) drug therapy: Secondary | ICD-10-CM | POA: Insufficient documentation

## 2015-06-07 DIAGNOSIS — R197 Diarrhea, unspecified: Secondary | ICD-10-CM | POA: Insufficient documentation

## 2015-06-07 LAB — URINALYSIS COMPLETE WITH MICROSCOPIC (ARMC ONLY)
BILIRUBIN URINE: NEGATIVE
Bacteria, UA: NONE SEEN
GLUCOSE, UA: NEGATIVE mg/dL
HGB URINE DIPSTICK: NEGATIVE
Ketones, ur: NEGATIVE mg/dL
Leukocytes, UA: NEGATIVE
NITRITE: NEGATIVE
PROTEIN: NEGATIVE mg/dL
Specific Gravity, Urine: 1.032 — ABNORMAL HIGH (ref 1.005–1.030)
pH: 6 (ref 5.0–8.0)

## 2015-06-07 LAB — COMPREHENSIVE METABOLIC PANEL
ALBUMIN: 4 g/dL (ref 3.5–5.0)
ALT: 93 U/L — ABNORMAL HIGH (ref 14–54)
ANION GAP: 6 (ref 5–15)
AST: 53 U/L — ABNORMAL HIGH (ref 15–41)
Alkaline Phosphatase: 94 U/L (ref 38–126)
BUN: 18 mg/dL (ref 6–20)
CALCIUM: 8.6 mg/dL — AB (ref 8.9–10.3)
CO2: 26 mmol/L (ref 22–32)
Chloride: 107 mmol/L (ref 101–111)
Creatinine, Ser: 0.52 mg/dL (ref 0.44–1.00)
GFR calc non Af Amer: >60 mL/min (ref >60)
Glucose, Bld: 103 mg/dL — ABNORMAL HIGH (ref 65–99)
Potassium: 3.1 mmol/L — ABNORMAL LOW (ref 3.5–5.1)
Sodium: 139 mmol/L (ref 135–145)
Total Bilirubin: 0.7 mg/dL (ref 0.3–1.2)
Total Protein: 7.6 g/dL (ref 6.5–8.1)

## 2015-06-07 LAB — CBC
HEMATOCRIT: 37.1 % (ref 35.0–47.0)
Hemoglobin: 12.2 g/dL (ref 12.0–16.0)
MCH: 27.9 pg (ref 26.0–34.0)
MCHC: 32.8 g/dL (ref 32.0–36.0)
MCV: 85.1 fL (ref 80.0–100.0)
Platelets: 257 10*3/uL (ref 150–440)
RBC: 4.37 MIL/uL (ref 3.80–5.20)
RDW: 15.9 % — AB (ref 11.5–14.5)
WBC: 7.9 10*3/uL (ref 3.6–11.0)

## 2015-06-07 LAB — POCT PREGNANCY, URINE: Preg Test, Ur: NEGATIVE

## 2015-06-07 LAB — LIPASE, BLOOD: LIPASE: 44 U/L (ref 22–51)

## 2015-06-07 MED ORDER — GI COCKTAIL ~~LOC~~
30.0000 mL | Freq: Once | ORAL | Status: AC
  Administered 2015-06-07: 30 mL via ORAL
  Filled 2015-06-07: qty 30

## 2015-06-07 MED ORDER — OMEPRAZOLE 40 MG PO CPDR: 40.0000 mg | DELAYED_RELEASE_CAPSULE | Freq: Every day | ORAL | Status: DC

## 2015-06-07 NOTE — ED Provider Notes (Addendum)
[facility] Emergency Department Provider Note  ____________________________________________  Time seen:   I have reviewed the triage vital signs and the nursing notes.   HISTORY  Chief Complaint Abdominal Pain      HPI Colleen Thomas is a 34 y.o. female presents with right upper quadrant pain. Currently 9 out of 10 and described as sharp and radiating to her back. Patient also admits to nausea and vomiting. Patient denies any fever no urinary complaints. Patient denies any diarrhea.       Past Medical History  Diagnosis Date  . AMA (advanced maternal age) multigravida 35+   . Gestational diabetes     glyburide    Patient Active Problem List   Diagnosis Date Noted  . Active labor 12/08/2014  . [redacted] weeks gestation of pregnancy   . Gestational diabetes mellitus, currently pregnant 10/09/2014  . AMA (advanced maternal age) multigravida 35+ 10/09/2014  . Late prenatal care 10/09/2014  . Illiteracy 10/09/2014  . 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 Cholecystectomy      Current Outpatient Rx  Name  Route  Sig  Dispense  Refill  . Prenatal Vit-Fe Fumarate-FA (PRENATAL VITAMINS PLUS) 27-1 MG TABS   Oral   Take 1 tablet by mouth daily.           Allergies Review of patient's allergies indicates no known allergies.  Family History  Problem Relation Age of Onset  . Alcohol abuse Neg Hx   . Arthritis Neg Hx   . Asthma Neg Hx   . Birth defects Neg Hx   . Cancer Neg Hx   . COPD Neg Hx   . Depression Neg Hx   . Diabetes Neg Hx   . Drug abuse Neg Hx   . Early death Neg Hx   . Hearing loss Neg Hx   . Heart disease Neg Hx   . Hyperlipidemia Neg Hx   . Hypertension Neg Hx   . Kidney disease Neg Hx   . Learning disabilities Neg Hx   . Mental illness Neg Hx   . Mental retardation Neg Hx   . Miscarriages / Stillbirths Neg Hx   . Stroke Neg Hx   . Vision  loss Neg Hx   . Varicose Veins Neg Hx     Social History History  Substance Use Topics  . Smoking status: Never Smoker   . Smokeless tobacco: Never Used  . Alcohol Use: No    Review of Systems  Constitutional: Negative for fever. Eyes: Negative for visual changes. ENT: Negative for sore throat. Cardiovascular: Negative for chest pain. Respiratory: Negative for shortness of breath. Gastrointestinal:positive for abdominal pain, vomiting and diarrhea. Genitourinary: Negative for dysuria. Musculoskeletal: Negative for back pain. Skin: Negative for rash. Neurological: Negative for headaches, focal weakness or numbness.   10-point ROS otherwise negative.  ____________________________________________   PHYSICAL EXAM:  VITAL SIGNS: ED Triage Vitals  Enc Vitals Group     BP 06/07/15 0232 146/70 mmHg     Pulse Rate 06/07/15 0232 64     Resp 06/07/15 0232 18     Temp 06/07/15 0232 98.3 F (36.8 C)     Temp Source 06/07/15 0232 Oral     SpO2 06/07/15 0232 98 %     Weight 06/07/15 0232 134 lb (60.782 kg)     Height --      Head Cir --  Peak Flow --      Pain Score 06/07/15 0233 9     Pain Loc --      Pain Edu? --      Excl. in GC? --      Constitutional: Alert and oriented. Well appearing and in no distress. Eyes: Conjunctivae are normal. PERRL. Normal extraocular movements. ENT   Head: Normocephalic and atraumatic.   Nose: No congestion/rhinnorhea.   Mouth/Throat: Mucous membranes are moist.   Neck: No stridor. Hematological/Lymphatic/Immunilogical: No cervical lymphadenopathy. Cardiovascular: Normal rate, regular rhythm. Normal and symmetric distal pulses are present in all extremities. No murmurs, rubs, or gallops. Respiratory: Normal respiratory effort without tachypnea nor retractions. Breath sounds are clear and equal bilaterally. No wheezes/rales/rhonchi. Gastrointestinal: RUQ pain,  No distention. There is no CVA tenderness. Genitourinary:  deferred Musculoskeletal: Nontender with normal range of motion in all extremities. No joint effusions.  No lower extremity tenderness nor edema. Neurologic:  Normal speech and language. No gross focal neurologic deficits are appreciated. Speech is normal.  Skin:  Skin is warm, dry and intact. No rash noted. Psychiatric: Mood and affect are normal. Speech and behavior are normal. Patient exhibits appropriate insight and judgment.  ____________________________________________    LABS (pertinent positives/negatives)  Labs Reviewed  COMPREHENSIVE METABOLIC PANEL - Abnormal; Notable for the following:    Potassium 3.1 (*)    Glucose, Bld 103 (*)    Calcium 8.6 (*)    AST 53 (*)    ALT 93 (*)    All other components within normal limits  CBC - Abnormal; Notable for the following:    RDW 15.9 (*)    All other components within normal limits  URINALYSIS COMPLETEWITH MICROSCOPIC ([facility] ONLY) - Abnormal; Notable for the following:    Color, Urine YELLOW (*)    APPearance CLEAR (*)    Specific Gravity, Urine 1.032 (*)    Squamous Epithelial / LPF 0-5 (*)    All other components within normal limits  LIPASE, BLOOD  POC URINE PREG, ED  POCT PREGNANCY, URINE     RADIOLOGY IMPRESSION: Status post cholecystectomy. No sonographic findings of choledocholithiasis.  Hepatic steatosis.   Electronically Signed By: Awilda Metro M.D. On: 06/07/2015 06:19     INITIAL IMPRESSION / ASSESSMENT AND PLAN / ED COURSE  Pertinent labs & imaging results that were available during my care of the patient were reviewed by me and considered in my medical decision making (see chart for details).  Patient referred to gastroenterologist  ____________________________________________   FINAL CLINICAL IMPRESSION(S) / ED DIAGNOSES  Final diagnoses:  Right upper quadrant pain      Darci Current, MD 06/08/15 1610  Darci Current, MD 06/21/15 9604  Darci Current,  MD 06/28/15 [phone]

## 2015-06-07 NOTE — ED Notes (Signed)
Interpreter Conservation officer, historic buildings at bedside to assist with discharge instructions. Questions answered for pt at this time. Pt verbalizes understanding of paperwork.

## 2015-06-07 NOTE — ED Notes (Signed)
Patient ambulatory to triage with steady gait, without difficulty or distress noted; pt reports right upper abd pain radiating into back tonight accomp by N/V

## 2015-06-07 NOTE — Discharge Instructions (Signed)
Dolor abdominal °(Abdominal Pain) °El dolor puede tener muchas causas. Normalmente la causa del dolor abdominal no es una enfermedad y mejorará sin tratamiento. Frecuentemente puede controlarse y tratarse en casa. Su médico le realizará un examen físico y posiblemente solicite análisis de sangre y radiografías para ayudar a determinar la gravedad de su dolor. Sin embargo, en muchos casos, debe transcurrir más tiempo antes de que se pueda encontrar una causa evidente del dolor. Antes de llegar a ese punto, es posible que su médico no sepa si necesita más pruebas o un tratamiento más profundo. °INSTRUCCIONES PARA EL CUIDADO EN EL HOGAR  °Esté atento al dolor para ver si hay cambios. Las siguientes indicaciones ayudarán a aliviar cualquier molestia que pueda sentir: °· Tome solo medicamentos de venta libre o recetados, según las indicaciones del médico. °· No tome laxantes a menos que se lo haya indicado su médico. °· Pruebe con una dieta líquida absoluta (caldo, té o agua) según se lo indique su médico. Introduzca gradualmente una dieta normal, según su tolerancia. °SOLICITE ATENCIÓN MÉDICA SI: °· Tiene dolor abdominal sin explicación. °· Tiene dolor abdominal relacionado con náuseas o diarrea. °· Tiene dolor cuando orina o defeca. °· Experimenta dolor abdominal que lo despierta de noche. °· Tiene dolor abdominal que empeora o mejora cuando come alimentos. °· Tiene dolor abdominal que empeora cuando come alimentos grasosos. °· Tiene fiebre. °SOLICITE ATENCIÓN MÉDICA DE INMEDIATO SI:  °· El dolor no desaparece en un plazo máximo de 2 horas. °· No deja de (vomitar). °· El dolor se siente solo en partes del abdomen, como el lado derecho o la parte inferior izquierda del abdomen. °· Evacúa materia fecal sanguinolenta o negra, de aspecto alquitranado. °ASEGÚRESE DE QUE: °· Comprende estas instrucciones. °· Controlará su afección. °· Recibirá ayuda de inmediato si no mejora o si empeora. °Document Released: 10/27/2005  Document Revised: 11/01/2013 °ExitCare® Patient Information ©2015 [employer]. This information is not intended to replace advice given to you by your health care provider. Make sure you discuss any questions you have with your health care provider. ° °

## 2015-07-11 ENCOUNTER — Other Ambulatory Visit: Payer: Self-pay

## 2015-07-11 ENCOUNTER — Encounter: Payer: Self-pay | Admitting: Emergency Medicine

## 2015-07-11 ENCOUNTER — Emergency Department: Payer: Medicaid Other

## 2015-07-11 ENCOUNTER — Emergency Department
Admission: EM | Admit: 2015-07-11 | Discharge: 2015-07-11 | Disposition: A | Discharge status: Home / Self Care | Payer: Self-pay | Attending: Emergency Medicine | Admitting: Emergency Medicine

## 2015-07-11 DIAGNOSIS — Z3202 Encounter for pregnancy test, result negative: Secondary | ICD-10-CM | POA: Insufficient documentation

## 2015-07-11 DIAGNOSIS — R109 Unspecified abdominal pain: Secondary | ICD-10-CM | POA: Insufficient documentation

## 2015-07-11 DIAGNOSIS — Z79899 Other long term (current) drug therapy: Secondary | ICD-10-CM | POA: Insufficient documentation

## 2015-07-11 LAB — COMPREHENSIVE METABOLIC PANEL
ALBUMIN: 3.9 g/dL (ref 3.5–5.0)
ALT: 44 U/L (ref 14–54)
ANION GAP: 5 (ref 5–15)
AST: 39 U/L (ref 15–41)
Alkaline Phosphatase: 97 U/L (ref 38–126)
BUN: 14 mg/dL (ref 6–20)
CALCIUM: 9.1 mg/dL (ref 8.9–10.3)
CO2: 27 mmol/L (ref 22–32)
Chloride: 106 mmol/L (ref 101–111)
Creatinine, Ser: 0.61 mg/dL (ref 0.44–1.00)
GFR calc Af Amer: >60 mL/min (ref >60)
GFR calc non Af Amer: >60 mL/min (ref >60)
GLUCOSE: 115 mg/dL — AB (ref 65–99)
Potassium: 3.2 mmol/L — ABNORMAL LOW (ref 3.5–5.1)
Sodium: 138 mmol/L (ref 135–145)
TOTAL PROTEIN: 7.2 g/dL (ref 6.5–8.1)
Total Bilirubin: 0.6 mg/dL (ref 0.3–1.2)

## 2015-07-11 LAB — CBC
HCT: 37.8 % (ref 35.0–47.0)
HEMOGLOBIN: 12.5 g/dL (ref 12.0–16.0)
MCH: 28.9 pg (ref 26.0–34.0)
MCHC: 33.1 g/dL (ref 32.0–36.0)
MCV: 87.2 fL (ref 80.0–100.0)
Platelets: 239 10*3/uL (ref 150–440)
RBC: 4.34 MIL/uL (ref 3.80–5.20)
RDW: 15.8 % — AB (ref 11.5–14.5)
WBC: 7.4 10*3/uL (ref 3.6–11.0)

## 2015-07-11 LAB — URINALYSIS COMPLETE WITH MICROSCOPIC (ARMC ONLY)
Bilirubin Urine: NEGATIVE
GLUCOSE, UA: NEGATIVE mg/dL
KETONES UR: NEGATIVE mg/dL
Leukocytes, UA: NEGATIVE
NITRITE: NEGATIVE
Protein, ur: NEGATIVE mg/dL
SPECIFIC GRAVITY, URINE: 1.028 (ref 1.005–1.030)
pH: 5 (ref 5.0–8.0)

## 2015-07-11 LAB — LIPASE, BLOOD: Lipase: 47 U/L (ref 22–51)

## 2015-07-11 LAB — POCT PREGNANCY, URINE: Preg Test, Ur: NEGATIVE

## 2015-07-11 LAB — PREGNANCY, URINE: Preg Test, Ur: NEGATIVE

## 2015-07-11 LAB — TROPONIN I: Troponin I: <0.03 ng/mL (ref <0.031)

## 2015-07-11 MED ORDER — OXYCODONE-ACETAMINOPHEN 5-325 MG PO TABS
1.0000 | ORAL_TABLET | Freq: Once | ORAL | Status: AC
  Administered 2015-07-11: 1 via ORAL

## 2015-07-11 MED ORDER — HYDROMORPHONE HCL 1 MG/ML IJ SOLN
1.0000 mg | Freq: Once | INTRAMUSCULAR | Status: AC
  Administered 2015-07-11: 1 mg via INTRAVENOUS
  Filled 2015-07-11: qty 1

## 2015-07-11 MED ORDER — GI COCKTAIL ~~LOC~~
30.0000 mL | Freq: Once | ORAL | Status: AC
  Administered 2015-07-11: 30 mL via ORAL

## 2015-07-11 MED ORDER — IOHEXOL 300 MG/ML  SOLN
100.0000 mL | Freq: Once | INTRAMUSCULAR | Status: AC | PRN
  Administered 2015-07-11: 100 mL via INTRAVENOUS

## 2015-07-11 MED ORDER — IOHEXOL 240 MG/ML SOLN
25.0000 mL | Freq: Once | INTRAMUSCULAR | Status: DC | PRN
  Administered 2015-07-11: 25 mL via ORAL
  Filled 2015-07-11: qty 50

## 2015-07-11 MED ORDER — OXYCODONE-ACETAMINOPHEN 5-325 MG PO TABS
ORAL_TABLET | ORAL | Status: AC
  Administered 2015-07-11: 1 via ORAL
  Filled 2015-07-11: qty 1

## 2015-07-11 MED ORDER — GI COCKTAIL ~~LOC~~
ORAL | Status: AC
  Filled 2015-07-11: qty 30

## 2015-07-11 MED ORDER — GI COCKTAIL ~~LOC~~
30.0000 mL | Freq: Once | ORAL | Status: AC
  Administered 2015-07-11: 30 mL via ORAL
  Filled 2015-07-11: qty 30

## 2015-07-11 NOTE — ED Notes (Addendum)
Patient ambulatory to triage with steady gait, without difficulty or distress noted; per [facility] interpreter, pt reports right sided abd pain radiating around to back x 3 days accomp by nausea; st pain increases after eating and causea chest "tightness"; st hx of same, seen here and dx with gallstone but did not f/u with surgeon

## 2015-07-11 NOTE — ED Notes (Signed)
Patient given GI cocktail after being moved to lobby by Dr. Darnelle Catalan

## 2015-07-11 NOTE — ED Provider Notes (Signed)
[facility] Emergency Department Provider Note  ____________________________________________  Time seen: Approximately 7:51 AM  I have reviewed the triage vital signs and the nursing notes.   HISTORY  Chief Complaint Abdominal Pain   HPI Colleen Thomas is a 34 y.o. female patient reports 3 days of abdominal pain. Pain radiates into her back. Specially into the right CVA area the pain is worse in the epigastric and right upper quadrant. It's crampy in nature she's nauseated but has no vomiting or diarrhea. She has no fever. She reports he feels full when she eats. The pain is moderately severe. The patient thinks it might feel something like before she had her gallbladder out.   Past Medical History  Diagnosis Date  . AMA (advanced maternal age) multigravida 35+   . Gestational diabetes     glyburide    Patient Active Problem List   Diagnosis Date Noted  . Active labor 12/08/2014  . [redacted] weeks gestation of pregnancy   . Gestational diabetes mellitus, currently pregnant 10/09/2014  . AMA (advanced maternal age) multigravida 35+ 10/09/2014  . Late prenatal care 10/09/2014  . Illiteracy 10/09/2014  . 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 Cholecystectomy      Current Outpatient Rx  Name  Route  Sig  Dispense  Refill  . omeprazole (PRILOSEC) 40 MG capsule   Oral   Take 1 capsule (40 mg total) by mouth daily.   30 capsule   1   . Prenatal Vit-Fe Fumarate-FA (PRENATAL VITAMINS PLUS) 27-1 MG TABS   Oral   Take 1 tablet by mouth daily.           Allergies Review of patient's allergies indicates no known allergies.  Family History  Problem Relation Age of Onset  . Alcohol abuse Neg Hx   . Arthritis Neg Hx   . Asthma Neg Hx   . Birth defects Neg Hx   . Cancer Neg Hx   . COPD Neg Hx   . Depression Neg Hx   . Diabetes Neg Hx   . Drug abuse Neg Hx   . Early  death Neg Hx   . Hearing loss Neg Hx   . Heart disease Neg Hx   . Hyperlipidemia Neg Hx   . Hypertension Neg Hx   . Kidney disease Neg Hx   . Learning disabilities Neg Hx   . Mental illness Neg Hx   . Mental retardation Neg Hx   . Miscarriages / Stillbirths Neg Hx   . Stroke Neg Hx   . Vision loss Neg Hx   . Varicose Veins Neg Hx     Social History Social History  Substance Use Topics  . Smoking status: Never Smoker   . Smokeless tobacco: Never Used  . Alcohol Use: No    Review of Systems Constitutional: No fever/chills Eyes: No visual changes. ENT: No sore throat. Cardiovascular: Denies chest pain. Respiratory: Denies shortness of breath. Gastrointestinal:  no vomiting.  No diarrhea.  No constipation. Genitourinary: Negative for dysuria. Musculoskeletal: See history of present illness Skin: Negative for rash. Neurological: Negative for headaches, focal weakness or numbness.  10-point ROS otherwise negative.  ____________________________________________   PHYSICAL EXAM:  VITAL SIGNS: ED Triage Vitals  Enc Vitals Group     BP 07/11/15 0303 102/64 mmHg     Pulse Rate 07/11/15 0303 56     Resp 07/11/15 0303 18  Temp 07/11/15 0303 98 F (36.7 C)     Temp Source 07/11/15 0303 Oral     SpO2 07/11/15 0303 100 %     Weight 07/11/15 0319 136 lb (61.689 kg)     Height --      Head Cir --      Peak Flow --      Pain Score 07/11/15 0316 8     Pain Loc --      Pain Edu? --      Excl. in GC? --     Constitutional: Alert and oriented. Well appearing and in no acute distress. Eyes: Conjunctivae are normal. PERRL. EOMI. Head: Atraumatic. Nose: No congestion/rhinnorhea. Mouth/Throat: Mucous membranes are moist.  Oropharynx non-erythematous. Neck: No stridor.  Cardiovascular: Normal rate, regular rhythm. Grossly normal heart sounds.  Good peripheral circulation. Respiratory: Normal respiratory effort.  No retractions. Lungs CTAB. Gastrointestinal: Soft but  diffusely tender. Worst tenderness is in the epigastric area. Patient is tender to both percussion and palpation. Bowel sounds are slightly decreased.. No distention. No abdominal bruits. No CVA tenderness. Musculoskeletal: No lower extremity tenderness nor edema.  No joint effusions. Neurologic:  Normal speech and language. No gross focal neurologic deficits are appreciated. No gait instability. Skin:  Skin is warm, dry and intact. No rash noted. Psychiatric: Mood and affect are normal. Speech and behavior are normal.  ____________________________________________   LABS (all labs ordered are listed, but only abnormal results are displayed)  Labs Reviewed  COMPREHENSIVE METABOLIC PANEL - Abnormal; Notable for the following:    Potassium 3.2 (*)    Glucose, Bld 115 (*)    All other components within normal limits  CBC - Abnormal; Notable for the following:    RDW 15.8 (*)    All other components within normal limits  URINALYSIS COMPLETEWITH MICROSCOPIC ([facility] ONLY) - Abnormal; Notable for the following:    Color, Urine YELLOW (*)    APPearance CLEAR (*)    Hgb urine dipstick 3+ (*)    Bacteria, UA RARE (*)    Squamous Epithelial / LPF 0-5 (*)    All other components within normal limits  LIPASE, BLOOD  TROPONIN I  PREGNANCY, URINE  POC URINE PREG, ED  POCT PREGNANCY, URINE   ____________________________________________  EKG   Shows sinus bradycardia at a rate of 54. Normal axis. Normal QRS interval but there is no RSR prime in V1 and V2 suggesting possibly anterior ventricular conduction delayEKG read and interpreted by me ____________________________________________  RADIOLOGY  CT shows no acute disease ____________________________________________   PROCEDURES    ____________________________________________   INITIAL IMPRESSION / ASSESSMENT AND PLAN / ED COURSE  Pertinent labs & imaging results that were available during my care of the patient were reviewed by me  and considered in my medical decision making (see chart for details). Patient given GI cocktail and is much better I will treat her with Nexium 41 day. If this is too expensive I wrote a prescription for Zantac 300 once a day that's too expensive I wrote a note for them to call the emergency room  ____________________________________________   FINAL CLINICAL IMPRESSION(S) / ED DIAGNOSES  Final diagnoses:  Abdominal pain, unspecified abdominal location      Arnaldo Natal, MD 07/11/15 [phone]

## 2015-07-11 NOTE — ED Notes (Signed)
Awaiting interpretor to evaluate patient.

## 2015-07-11 NOTE — Discharge Instructions (Signed)
Dolor abdominal en las mujeres °(Abdominal Pain, Women) °El dolor abdominal (en el estómago, la pelvis o el vientre) puede tener muchas causas. Es importante que le informe a su médico: °· La ubicación del dolor. °· ¿Viene y se va, o persiste todo el tiempo? °· ¿Hay situaciones que inician el dolor (comer ciertos alimentos, la actividad física)? °· ¿Tiene otros síntomas asociados al dolor (fiebre, náuseas, vómitos, diarrea)? °Todo es de gran ayuda cuando se trata de hallar la causa del dolor. °CAUSAS °· Estómago: Infecciones por virus o bacterias, o úlcera. °· Intestino: Apendicitis (apéndice inflamado), ileitis regional (enfermedad de Crohn), colitis ulcerosa (colon inflamado), síndrome del colon irritable, diverticulitis (inflamación de los divertículos del colon) o cáncer de estómago oo intestino. °· Enfermedades de la vesícula biliar o cálculos. °· Enfermedades renales, cálculos o infecciones en el riñón. °· Infección o cáncer del páncreas. °· Fibromialgia (trastorno doloroso) °· Enfermedades de los órganos femeninos: °¨ Uterus: Útero: fibroma (tumor no canceroso) o infección °¨ Trompas de Falopio: infección o embarazo ectópico °¨ En los ovarios, quistes o tumores. °¨ Adherencias pélvicas (tejido cicatrizal). °¨ Endometriosis (el tejido que cubre el útero se desarrolla en la pelvis y los órganos pélvicos). °¨ Síndrome de congestión pélvica (los órganos femeninos se llenan de sangre antes del periodo menstrual( °¨ Dolor durante el periodo menstrual. °¨ Dolor durante la ovulación (al producir óvulos). °¨ Dolor al usar el DIU (dispositivo intrauterino para el control de la natalidad) °¨ Cáncer en los órganos femeninos. °· Dolor funcional (no está originado en una enfermedad, puede mejorar sin tratamiento). °· Dolor de origen psicológico °· Depresión. °DIAGNÓSTICO °Su médico decidirá la gravedad del dolor a través del examen físico °· Análisis de sangre °· Radiografías °· Ecografías °· TC (tomografía computada, tipo  especial de radiografías). °· IMR (resonancia magnética) °· Cultivos, en el caso una infección °· Colon por enema de bario (se inserta una sustancia de contraste en el intestino grueso para mejorar la observación con rayos X.) °· Colonoscopía (observación del intestino con un tubo luminoso). °· Laparoscopía (examen del interior del abdomen con un tubo que tiene una luz). °· Cirugía exploratoria abdominal mayor (se observa el abdomen realizando una gran incisión). °TRATAMIENTO °El tratamiento dependerá de la causa del problema.  °· Muchos de estos casos pueden controlarse y tratarse en casa. °· Medicamentos de venta libre indicados por el médico. °· Medicamentos con receta. °· Antibióticos, en caso de infección °· Píldoras anticonceptivas, en el caso de períodos dolorosos o dolor al ovular. °· Tratamiento hormonal, para la endometriosis °· Inyecciones para bloqueo nervioso selectivo. °· Fisioterapia. °· Antidepresivos. °· Consejos por parte de un psícólogo o psiquiatra. °· Cirugía mayor o menor. °INSTRUCCIONES PARA EL CUIDADO DOMICILIARIO °· No tome ni administre laxantes a menos que se lo haya indicado su médico. °· Tome analgésicos de venta libre sólo si se lo ha indicado el profesional que lo asiste. No tome aspirina, ya que puede causar molestias en el estómago o hemorragias. °· Consuma una dieta líquida (caldo o agua) según lo indicado por el médico. Progrese lentamente a una dieta blanda, según la tolerancia, si el dolor se relaciona con el estómago o el intestino. °· Tenga un termómetro y tómese la temperatura varias veces al día. °· Haga reposo en la cama y duerma, si esto alivia el dolor. °· Evite las relaciones sexuales, si le producen dolor. °· Evite las situaciones estresantes. °· Cumpla con las visitas y los análisis de control, según las indicaciones de su médico. °· Si el dolor   no se [employer] o la [place], [place] tratar con:  Acupuntura.  Ejercicios de relajacin (yoga,  meditacin).  Terapia grupal.  Psicoterapia. SOLICITE ATENCIN MDICA SI:  Nota que ciertos Pharmacist, community de [place].  El tratamiento indicado para Arboriculturist no Marketing executive.  Necesita analgsicos ms fuertes.  Quiere que le retiren el DIU.  Si se siente confundido o desfalleciente.  Presenta nuseas o vmitos.  Aparece una erupcin cutnea.  Sufre efectos adversos o una reaccin alrgica debido a los medicamentos que toma. SOLICITE ATENCIN MDICA DE INMEDIATO SI:  El dolor persiste o se agrava.  Tiene fiebre.  Siente el dolor slo en algunos sectores del abdomen. Si se localiza en la zona derecha, posiblemente podra tratarse de apendicitis. En un adulto, si se localiza en la regin inferior izquierda del abdomen, podra tratarse de colitis o diverticulitis.  Hay sangre en las heces (deposiciones de color rojo brillante o negro alquitranado), con o sin vmitos.  Usted presenta sangre en la orina.  Siente escalofros con o sin fiebre.  Se desmaya. ASEGRESE QUE:   Comprende estas instrucciones.  Controlar su enfermedad.  Solicitar ayuda de inmediato si no mejora o si empeora. Document Released: 02/12/2009 Document Revised: 01/19/2012 [facility] Patient Information 2015 [place], [place]. This information is not intended to replace advice given to you by your health care provider. Make sure you discuss any questions you have with your health care provider.   Follow up with  Dr Katrinka Blazing or Dr Michela Pitcher. Get your prescriptions filled. If they are too expensive have the pharmacy call us while you are there and we can try something different. Haga seguimiento con el Dr. Katrinka Blazing o el Dr. Michela Pitcher.  Surta sus recetas medicas.  Si son demasiado caras, digale al farmaceutico que nos llame mientras espera para que intentemos darle algo diferente.  Si todo Sports coach caro, trate de Product manager 2 Tums a la vez cada par de horas por varios dias.

## 2015-07-12 LAB — POCT PREGNANCY, URINE: PREG TEST UR: NEGATIVE

## 2015-11-11 NOTE — L&D Delivery Note (Signed)
Delivery Note At 11:20 AM a viable and female was delivered via Vaginal, Spontaneous Delivery (Presentation: ;loa  ).  APGAR8/9: , ; weight  .   Placenta status intact : , .  Cord:delayed clamping  with the following complications: .   Rapid second stage . Mother out of control , but delivery was performed with difficulty . Vigorous female placed on abd .  Anesthesia:  CLE ( non functioning ) Episiotomy:  none Lacerations:  none Suture Repair: n/a Est. Blood Loss (mL):  150 cc Mom to postpartum.  Baby to Couplet care / Skin to Skin. 0.2 mg IM Methergine given second to grand Multiparous status  Devaris Quirk 08/19/2016, 11:27 AM

## 2016-01-29 DIAGNOSIS — Z3A10 10 weeks gestation of pregnancy: Secondary | ICD-10-CM | POA: Insufficient documentation

## 2016-01-29 DIAGNOSIS — O09521 Supervision of elderly multigravida, first trimester: Secondary | ICD-10-CM | POA: Insufficient documentation

## 2016-01-29 DIAGNOSIS — Z79899 Other long term (current) drug therapy: Secondary | ICD-10-CM | POA: Insufficient documentation

## 2016-01-29 DIAGNOSIS — O2341 Unspecified infection of urinary tract in pregnancy, first trimester: Secondary | ICD-10-CM | POA: Insufficient documentation

## 2016-01-29 LAB — CBC
HEMATOCRIT: 38.1 % (ref 35.0–47.0)
HEMOGLOBIN: 13.1 g/dL (ref 12.0–16.0)
MCH: 30.4 pg (ref 26.0–34.0)
MCHC: 34.5 g/dL (ref 32.0–36.0)
MCV: 88.2 fL (ref 80.0–100.0)
PLATELETS: 231 10*3/uL (ref 150–440)
RBC: 4.31 MIL/uL (ref 3.80–5.20)
RDW: 14.2 % (ref 11.5–14.5)
WBC: 8.7 10*3/uL (ref 3.6–11.0)

## 2016-01-29 LAB — URINALYSIS COMPLETE WITH MICROSCOPIC (ARMC ONLY)
Bilirubin Urine: NEGATIVE
GLUCOSE, UA: NEGATIVE mg/dL
Ketones, ur: NEGATIVE mg/dL
Nitrite: NEGATIVE
PH: 8 (ref 5.0–8.0)
Protein, ur: NEGATIVE mg/dL
SPECIFIC GRAVITY, URINE: 1.019 (ref 1.005–1.030)

## 2016-01-29 LAB — COMPREHENSIVE METABOLIC PANEL
ALT: 18 U/L (ref 14–54)
ANION GAP: 5 (ref 5–15)
AST: 20 U/L (ref 15–41)
Albumin: 3.6 g/dL (ref 3.5–5.0)
Alkaline Phosphatase: 67 U/L (ref 38–126)
BUN: 9 mg/dL (ref 6–20)
CHLORIDE: 105 mmol/L (ref 101–111)
CO2: 24 mmol/L (ref 22–32)
CREATININE: 0.57 mg/dL (ref 0.44–1.00)
Calcium: 8.6 mg/dL — ABNORMAL LOW (ref 8.9–10.3)
Glucose, Bld: 87 mg/dL (ref 65–99)
POTASSIUM: 3.8 mmol/L (ref 3.5–5.1)
SODIUM: 134 mmol/L — AB (ref 135–145)
Total Bilirubin: 0.3 mg/dL (ref 0.3–1.2)
Total Protein: 7.4 g/dL (ref 6.5–8.1)

## 2016-01-29 LAB — LIPASE, BLOOD: LIPASE: 31 U/L (ref 11–51)

## 2016-01-29 LAB — POCT PREGNANCY, URINE: PREG TEST UR: POSITIVE — AB

## 2016-01-29 NOTE — ED Notes (Addendum)
Pt reports she has abd pain for 1 day.  No n/v/d.  Pt also reports pain in back.  Denies urinary sx.   Video interpreter used in triage.

## 2016-01-29 NOTE — ED Notes (Signed)
poct pregnancy Positive 

## 2016-01-30 ENCOUNTER — Emergency Department
Admission: EM | Admit: 2016-01-30 | Discharge: 2016-01-30 | Disposition: A | Discharge status: Home / Self Care | Payer: Self-pay | Attending: Emergency Medicine | Admitting: Emergency Medicine

## 2016-01-30 ENCOUNTER — Emergency Department: Payer: Self-pay

## 2016-01-30 DIAGNOSIS — R109 Unspecified abdominal pain: Secondary | ICD-10-CM

## 2016-01-30 DIAGNOSIS — O26899 Other specified pregnancy related conditions, unspecified trimester: Secondary | ICD-10-CM

## 2016-01-30 DIAGNOSIS — O2341 Unspecified infection of urinary tract in pregnancy, first trimester: Secondary | ICD-10-CM

## 2016-01-30 DIAGNOSIS — R1032 Left lower quadrant pain: Secondary | ICD-10-CM

## 2016-01-30 DIAGNOSIS — Z3490 Encounter for supervision of normal pregnancy, unspecified, unspecified trimester: Secondary | ICD-10-CM

## 2016-01-30 DIAGNOSIS — R52 Pain, unspecified: Secondary | ICD-10-CM

## 2016-01-30 LAB — CHLAMYDIA/NGC RT PCR (ARMC ONLY)
CHLAMYDIA TR: NOT DETECTED
N gonorrhoeae: NOT DETECTED

## 2016-01-30 LAB — HCG, QUANTITATIVE, PREGNANCY: HCG, BETA CHAIN, QUANT, S: 80343 m[IU]/mL — AB (ref <5)

## 2016-01-30 LAB — WET PREP, GENITAL
Clue Cells Wet Prep HPF POC: NONE SEEN
SPERM: NONE SEEN
Trich, Wet Prep: NONE SEEN
YEAST WET PREP: NONE SEEN

## 2016-01-30 MED ORDER — NITROFURANTOIN MONOHYD MACRO 100 MG PO CAPS
100.0000 mg | ORAL_CAPSULE | Freq: Once | ORAL | Status: AC
  Administered 2016-01-30: 100 mg via ORAL
  Filled 2016-01-30: qty 1

## 2016-01-30 MED ORDER — NITROFURANTOIN MONOHYD MACRO 100 MG PO CAPS: 100.0000 mg | ORAL_CAPSULE | Freq: Two times a day (BID) | ORAL | Status: DC

## 2016-01-30 MED ORDER — ONDANSETRON 4 MG PO TBDP: 4.0000 mg | ORAL_TABLET | Freq: Three times a day (TID) | ORAL | Status: DC | PRN

## 2016-01-30 NOTE — ED Provider Notes (Signed)
[facility] Emergency Department Provider Note  ____________________________________________  Time seen: Approximately 2:26 AM  I have reviewed the triage vital signs and the nursing notes.   HISTORY  Chief Complaint Abdominal Pain  History obtained via video Spanish interpreter  HPI Colleen Thomas is a 35 y.o. female who presents to the ED from home with a chief complaint of low abdominal and back pain. Patient reports onset of pain yesterday morning. Describes pelvic cramping especially on her left side and also lower back. Denies associated fever, chills, nausea, vomiting, diarrhea, dysuria. Denies vaginal bleeding or discharge. Denies chest pain or shortness of breath. Last sexual intercourse one week ago. Unknown pregnancy status as patient states she has periods every 8 weeks. Denies recent travel or trauma. Nothing makes her symptoms better or worse.   Past Medical History  Diagnosis Date  . AMA (advanced maternal age) multigravida 35+   . Gestational diabetes     glyburide    Patient Active Problem List   Diagnosis Date Noted  . Active labor 12/08/2014  . [redacted] weeks gestation of pregnancy   . Gestational diabetes mellitus, currently pregnant 10/09/2014  . AMA (advanced maternal age) multigravida 35+ 10/09/2014  . Late prenatal care 10/09/2014  . Illiteracy 10/09/2014  . 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 Cholecystectomy      Current Outpatient Rx  Name  Route  Sig  Dispense  Refill  . omeprazole (PRILOSEC) 40 MG capsule   Oral   Take 1 capsule (40 mg total) by mouth daily.   30 capsule   1     Allergies Review of patient's allergies indicates no known allergies.  Family History  Problem Relation Age of Onset  . Alcohol abuse Neg Hx   . Arthritis Neg Hx   . Asthma Neg Hx   . Birth defects Neg Hx   . Cancer Neg Hx   . COPD Neg Hx   .  Depression Neg Hx   . Diabetes Neg Hx   . Drug abuse Neg Hx   . Early death Neg Hx   . Hearing loss Neg Hx   . Heart disease Neg Hx   . Hyperlipidemia Neg Hx   . Hypertension Neg Hx   . Kidney disease Neg Hx   . Learning disabilities Neg Hx   . Mental illness Neg Hx   . Mental retardation Neg Hx   . Miscarriages / Stillbirths Neg Hx   . Stroke Neg Hx   . Vision loss Neg Hx   . Varicose Veins Neg Hx     Social History Social History  Substance Use Topics  . Smoking status: Never Smoker   . Smokeless tobacco: Never Used  . Alcohol Use: No    Review of Systems  Constitutional: No fever/chills Eyes: No visual changes. ENT: No sore throat. Cardiovascular: Denies chest pain. Respiratory: Denies shortness of breath. Gastrointestinal: Positive for pelvic pain. No abdominal pain.  No nausea, no vomiting.  No diarrhea.  No constipation. Genitourinary: Negative for dysuria. Musculoskeletal: Negative for back pain. Skin: Negative for rash. Neurological: Negative for headaches, focal weakness or numbness.  10-point ROS otherwise negative.  ____________________________________________   PHYSICAL EXAM:  VITAL SIGNS: ED Triage Vitals  Enc Vitals Group     BP 01/29/16 2215 100/60 mmHg     Pulse Rate 01/29/16 2215 66     Resp 01/29/16 2215 20  Temp 01/29/16 2215 98.6 F (37 C)     Temp Source 01/29/16 2215 Oral     SpO2 01/29/16 2215 99 %     Weight 01/29/16 2215 150 lb (68.04 kg)     Height 01/29/16 2215  (1.626 m)     Head Cir --      Peak Flow --      Pain Score 01/29/16 2210 9     Pain Loc --      Pain Edu? --      Excl. in GC? --     Constitutional: Alert and oriented. Well appearing and in no acute distress. Eyes: Conjunctivae are normal. PERRL. EOMI. Head: Atraumatic. Nose: No congestion/rhinnorhea. Mouth/Throat: Mucous membranes are moist.  Oropharynx non-erythematous. Neck: No stridor.   Cardiovascular: Normal rate, regular rhythm. Grossly normal  heart sounds.  Good peripheral circulation. Respiratory: Normal respiratory effort.  No retractions. Lungs CTAB. Gastrointestinal: Soft and mildly tender to palpation left lower quadrant and suprapubic area. No distention. No abdominal bruits. No CVA tenderness. Musculoskeletal: No lower extremity tenderness nor edema.  No joint effusions. Neurologic:  Normal speech and language. No gross focal neurologic deficits are appreciated. No gait instability. Skin:  Skin is warm, dry and intact. No rash noted. Psychiatric: Mood and affect are normal. Speech and behavior are normal.  ____________________________________________   LABS (all labs ordered are listed, but only abnormal results are displayed)  Labs Reviewed  WET PREP, GENITAL - Abnormal; Notable for the following:    WBC, Wet Prep HPF POC FEW (*)    All other components within normal limits  COMPREHENSIVE METABOLIC PANEL - Abnormal; Notable for the following:    Sodium 134 (*)    Calcium 8.6 (*)    All other components within normal limits  URINALYSIS COMPLETEWITH MICROSCOPIC ([facility] ONLY) - Abnormal; Notable for the following:    Color, Urine YELLOW (*)    APPearance CLOUDY (*)    Hgb urine dipstick 1+ (*)    Leukocytes, UA TRACE (*)    Bacteria, UA RARE (*)    Squamous Epithelial / LPF 0-5 (*)    All other components within normal limits  HCG, QUANTITATIVE, PREGNANCY - Abnormal; Notable for the following:    hCG, Beta Chain, Quant, S 16109 (*)    All other components within normal limits  POCT PREGNANCY, URINE - Abnormal; Notable for the following:    Preg Test, Ur POSITIVE (*)    All other components within normal limits  CHLAMYDIA/NGC RT PCR ([facility] ONLY)  LIPASE, BLOOD  CBC  POC URINE PREG, ED   ____________________________________________  EKG  None ____________________________________________  RADIOLOGY  Ultrasound interpreted per Dr. Gwenyth Bender: Single live intrauterine pregnancy with an estimated  gestational age of [redacted] weeks, 0 days based on today's ultrasound.  Unremarkable maternal ovaries. ____________________________________________   PROCEDURES  Procedure(s) performed:   Pelvic exam: External exam WNL without rashes, lesions or vesicles. Mild white discharge noted on speculum exam. No vaginal bleeding. Cervical os closed. Bimanual exam reveals mild left adnexal tenderness.  Critical Care performed: No  ____________________________________________   INITIAL IMPRESSION / ASSESSMENT AND PLAN / ED COURSE  Pertinent labs & imaging results that were available during my care of the patient were reviewed by me and considered in my medical decision making (see chart for details).  35 year old female G6 P5 Ab0 approximately [redacted] weeks pregnant with left pelvic pain. Will send for pelvic ultrasound.  ----------------------------------------- 5:14 AM on 01/30/2016 -----------------------------------------  Patient is resting  in no acute distress. Updated patient of wet prep, DNA and ultrasound results. Advised Tylenol as needed and follow up with OB next week. Will treat empirically with Macrobid as patient is experiencing suprapubic and low back pain. She has no point tenderness at McBurney's point. Strict return precautions given. Patient verbalizes understanding and agrees with plan of care. ____________________________________________   FINAL CLINICAL IMPRESSION(S) / ED DIAGNOSES  Final diagnoses:  Pain  Pregnancy  Abdominal pain in pregnancy  UTI in pregnancy, first trimester      [name], MD 01/30/16 [id number]

## 2016-01-30 NOTE — Discharge Instructions (Signed)
1. Take antibiotic as prescribed (Macrobid 100 mg twice daily 7 days). 2. You may take nausea medicine as needed for morning sickness (Zofran #20). 3. Return to the ER for worsening symptoms, persistent vomiting, fever or other concerns.  Embarazo e infeccin del tracto urinario (Pregnancy and Urinary Tract Infection) Una infeccin urinaria (IU) puede ocurrir en Corporate treasurer del tracto urinario. La infeccin urinaria puede [employer] utteres, los riones (pielonefritis), la vejiga (cistitis) y Engineer, mining (uretritis). Todas las mujeres embarazadas deben ser estudiadas para diagnosticar la presencia de bacterias en el tracto urinario. La identificacin y el tratamiento de una infeccin urinaria disminuye el riesgo de un parto prematuro y de Environmental education officer infecciones ms graves en la madre y el beb. CAUSAS Las bacterias causan casi todas las infecciones urinarias.  FACTORES DE RIESGO Hay muchos factores que pueden aumentar sus probabilidades de contraer una infeccin urinaria (IU) durante el [place]. Pueden ser:  Winferd Humphrey uretra corta.  Falta de aseo y malos hbitos de higiene.  Las [employer].  Obstruccin de la orina en el tracto urinario.  Problemas con los msculos o nervios plvicos.  Diabetes.  Obesidad.  Problemas en la vejiga despus de tener varios hijos.  Antecedentes de infeccin urinaria. SIGNOS Y SNTOMAS   Dolor, ardor o sensacin de ardor al [employer].  Sentir la necesidad de Geographical information systems officer de inmediato [place]).  Prdida del control vesical (incontinencia urinaria).  Orinar con ms frecuencia de lo comn en el embarazo.  Malestar en la zona inferior del abdomen o en la espalda.  Mason Jim turbia.  Sangre en la orina (hematuria).  Grant Ruts. Cuando se infectan los riones, los sntomas pueden ser:  Dolor de espalda.  Dolor lateral en el lado derecho ms que en el lado izquierdo.  Grant Ruts.  Escalofros.  Nuseas.  Vmitos. DIAGNSTICO  Una  infeccin del tracto urinario se suele diagnosticar a travs de la orina. A veces se realizan pruebas y procedimientos adicionales. Estos pueden ser:  [place]ice [place] de los riones, los urteres, la vejiga y Engineer, mining.  Observar la vejiga con un tubo que ilumina (cistoscopa). TRATAMIENTO Por lo general, las IU pueden tratarse con medicamentos antibiticos.  INSTRUCCIONES PARA EL CUIDADO EN EL HOGAR   Tome slo medicamentos de venta libre o recetados, segn las indicaciones del mdico. Si le han recetado antibiticos, tmelos segn las indicaciones. Tmelos todos, aunque se sienta mejor.  Beba suficiente lquido para Photographer orina clara o de color amarillo plido.  No tenga relaciones sexuales hasta que la infeccin haya desaparecido o el mdico la autorice.  Asegrese de [employer] hagan estudios para Engineer, manufacturing una infeccin urinaria durante el [place]. Estas infecciones suelen reaparecer. Para prevenir una infeccin urinaria en el futuro  Practique buenos hbitos higinicos. Siempre debe limpiarse desde adelante hacia atrs. Use el tissue slo una vez.  No retenga la orina. Orine tan pronto como sea posible cuando tenga ganas.  No se haga duchas vaginales ni use desodorantes en aerosol.  Lave con agua tibia y jabn alrededor de la zona genital y el ano.  Vace la vejiga antes y despus de Management consultant.  Use ropa interior con algodn en la entrepierna.  Evite la cafena y las [address]. Estas sustancias irritan la vejiga.  Beba jugo de arndanos o tome comprimidos de arndano. Esto puede disminuir el riesgo de sufrir una infeccin urinaria.  No beba alcohol.  Cumpla con las visitas de control y hgase todos los anlisis segn lo programado. SOLICITE ATENCIN MDICA SI:   Los  sntomas empeoran.  Tiene fiebre an despus de [address]  [address] de [address].  Tiene una erupcin.  Siente que usted tiene problemas con los medicamentos recetados.  Tiene  flujo vaginal anormal. SOLICITE ATENCIN MDICA DE INMEDIATO SI:   Siente dolor en la espalda o a los lados.  Tiene escalofros.  Observa sangre en la orina.  Tiene nuseas o vmitos.  Siente contracciones en el tero.  Tiene una perdida de lquido en chorro por la vagina. ASEGRESE DE QUE:  Comprende estas instrucciones.   Controlar su afeccin.   Recibir ayuda de inmediato si no mejora o si empeora.    Esta informacin no tiene Theme park manager el consejo del mdico. Asegrese de hacerle al mdico cualquier pregunta que tenga.   Document Released: 07/21/2012 Document Revised: 08/17/2013 Elsevier Interactive Patient Education 2016 [employer].   Primer trimestre de Psychiatrist (First Trimester of Pregnancy) El primer trimestre de Psychiatrist se extiende desde la semana1 hasta el final de la semana12 (mes1 al mes3). Una semana despus de que un espermatozoide fecunda un vulo, este se implantar en la pared uterina. Este embrin comenzar a Camera operator convertirse en un beb. Sus genes y los de su pareja forman el beb. Los genes del varn determinan si ser un nio o una nia. Entre la semana6 y [place], se forman los ojos y [place], y los latidos del corazn pueden verse en la ecografa. Al final de las 12semanas, todos los rganos del beb estn formados.  Ahora que est embarazada, querr hacer todo lo que est a su alcance para tener un beb sano. Dos de las cosas ms importantes son Winferd Humphrey buena atencin prenatal y seguir las indicaciones del mdico. La atencin prenatal incluye toda la asistencia mdica que usted recibe antes del nacimiento del beb. Esta ayudar a prevenir, Engineer, manufacturing y tratar cualquier problema durante el embarazo y [place]. CAMBIOS EN EL ORGANISMO Su organismo atraviesa por muchos cambios durante el [place], y estos varan de Neomia Dear mujer a Educational psychologist.   Al principio, puede aumentar o bajar algunos kilos.  Puede tener Programme researcher, broadcasting/film/video  (nuseas) y vomitar. Si no puede controlar los vmitos, llame al mdico.  Puede cansarse con facilidad.  Es posible que tenga dolores de cabeza que pueden aliviarse con los medicamentos que el mdico le permita tomar.  Puede orinar con mayor frecuencia. El dolor al orinar puede significar que usted tiene una infeccin de la vejiga.  Debido al Vanetta Mulders, puede tener acidez estomacal.  Puede estar estreida, ya que ciertas hormonas enlentecen los movimientos de los msculos que [employer] desechos a travs de los intestinos.  Pueden aparecer hemorroides o abultarse e hincharse las venas (venas varicosas).  Las [employer] pueden empezar a Government social research officer y Emergency planning/management officer. Los pezones pueden sobresalir ms, y el tejido que los rodea (areola) tornarse ms oscuro.  Las Veterinary surgeon y estar sensibles al cepillado y al hilo dental.  Pueden aparecer zonas oscuras o manchas (cloasma, mscara del Psychiatrist) en el rostro que probablemente se atenuarn despus del nacimiento del beb.  Los perodos menstruales se interrumpirn.  Tal vez no tenga apetito.  Puede sentir un fuerte deseo de consumir ciertos alimentos.  Puede tener cambios a Theatre manager a da, por ejemplo, por momentos puede estar emocionada por el Psychiatrist y por otros preocuparse porque algo pueda salir mal con el embarazo o el beb.  Tendr sueos ms vvidos y extraos.  Tal vez haya cambios en el cabello que pueden incluir su engrosamiento,  crecimiento rpido y [employer] textura. A algunas mujeres tambin se les cae el cabello durante o despus del [place], o tienen el cabello seco o fino. Lo ms probable es que el cabello se le normalice despus del nacimiento del beb. QU DEBE ESPERAR EN LAS CONSULTAS PRENATALES Durante una visita prenatal de rutina:  La pesarn para asegurarse de que usted y el beb estn creciendo normalmente.  Le controlarn la presin arterial.  Le medirn el abdomen para controlar el  desarrollo del beb.  Se escucharn los latidos cardacos a partir de la semana10 o la12 de embarazo, aproximadamente.  Se analizarn los resultados de los estudios solicitados en visitas anteriores. El mdico puede preguntarle:  Cmo se siente.  Si siente los movimientos del beb.  Si ha tenido sntomas anormales, como prdida de lquido, [place], dolores de cabeza intensos o clicos abdominales.  Si est consumiendo algn producto que contenga tabaco, como cigarrillos, tabaco de Theatre manager y Administrator, Civil Service.  Si tiene [employer]. Otros estudios que pueden realizarse durante el primer trimestre incluyen lo siguiente:  Anlisis de sangre para determinar el tipo de sangre y Engineer, manufacturing la presencia de infecciones previas. Adems, se los usar para controlar si los niveles de hierro son bajos (anemia) y Chief Strategy Officer los anticuerpos Rh. En una etapa ms avanzada del [place], se harn anlisis de sangre para saber si tiene diabetes, junto con otros estudios si surgen problemas.  Anlisis de orina para detectar infecciones, diabetes o protenas en la orina.  Una ecografa para confirmar que el beb crece y se desarrolla correctamente.  Una amniocentesis para diagnosticar posibles problemas genticos.  Estudios del feto para descartar espina bfida y sndrome de [place].  Es posible que necesite otras pruebas adicionales.  Prueba del VIH (virus de inmunodeficiencia humana). Los exmenes prenatales de rutina incluyen la prueba de deteccin del VIH, a menos que decida no Futures trader. INSTRUCCIONES PARA EL CUIDADO EN EL HOGAR  Medicamentos:  Siga las indicaciones del mdico en relacin con el uso de medicamentos. Durante el embarazo, hay medicamentos que pueden tomarse y otros que no.  Tome las vitaminas prenatales como se le indic.  Si est estreida, tome un laxante suave, si el mdico lo [place]. Dieta  Consuma alimentos balanceados. Elija alimentos variados, como carne o  protenas de origen vegetal, pescado, leche y productos lcteos descremados, verduras, frutas y panes y Radiation protection practitioner. El mdico la ayudar a Production assistant, radio cantidad de peso que puede [place].  No coma carne cruda ni quesos sin cocinar. Estos elementos contienen bacterias que pueden causar defectos congnitos en el beb.  La ingesta diaria de cuatro o cinco comidas pequeas en lugar de tres comidas abundantes puede ayudar a [employer] nuseas y los vmitos. Si empieza a tener nuseas, comer algunas [address] puede ser de [place]. Beber lquidos [place] comidas en lugar de tomarlos durante las comidas tambin puede ayudar a Optician, dispensing las nuseas y los vmitos.  Si est estreida, consuma alimentos con alto contenido de [place], como verduras y frutas frescas, y Radiation protection practitioner. Beba suficiente lquido para Photographer orina clara o de color amarillo plido. Actividad y Landscape architect ejercicio solamente como se lo haya indicado el mdico. El ejercicio la ayudar a:  Art gallery manager.  Mantenerse en forma.  Estar preparada para el trabajo de parto y [place].  Los dolores, los clicos en la parte baja del abdomen o los calambres en la cintura son un buen indicio de que debe dejar de Corporate treasurer.  Consulte al mdico antes de seguir haciendo ejercicios normales.  Intente no estar de pie FedExdurante mucho tiempo. Mueva las piernas con frecuencia si debe estar de pie en un lugar durante mucho tiempo.  Evite levantar pesos Fortune Brandsexcesivos.  Use zapatos de tacones bajos y [place]mantenga una buena postura.  Puede seguir teniendo The St. Paul Travelersrelaciones sexuales, excepto que el mdico le indique lo contrario. Alivio del dolor o las molestias  Use un sostn que le brinde buen soporte si siente dolor a la palpacin Mattelen las mamas.  Dese baos de asiento con agua tibia para Engineer, materialsaliviar el dolor o las molestias causadas por las hemorroides. Use crema antihemorroidal si el mdico se lo permite.  Descanse con las  piernas elevadas si tiene calambres o dolor de cintura.  Si tiene venas varicosas en las piernas, use medias de descanso. Eleve los pies durante 15minutos, 3 o 4veces por da. Limite la cantidad de sal en su dieta. Cuidados prenatales  Programe las visitas prenatales para la semana12 de [place]. Generalmente se programan cada mes al principio y se hacen ms frecuentes en los 2 ltimos meses antes del parto.  Escriba sus preguntas. Llvelas cuando concurra a las visitas prenatales.  Concurra a todas las visitas prenatales como se lo haya indicado el mdico. Seguridad  Colquese el cinturn de seguridad cuando conduzca.  Haga una lista de los nmeros de telfono de Associate Professoremergencia, que W. R. Berkleyincluya los nmeros de telfono de familiares, Spurgeonamigos, el hospital y los departamentos de polica y bomberos. Consejos generales  Pdale al mdico que la derive a clases de educacin prenatal en su localidad. Debe comenzar a tomar las clases antes de Cytogeneticistentrar en el mes6 de embarazo.  Pida ayuda si tiene necesidades nutricionales o de asesoramiento Academic librariandurante el embarazo. El mdico puede aconsejarla o derivarla a especialistas para que la ayuden con diferentes necesidades.  No se d baos de inmersin en agua caliente, baos turcos ni saunas.  No se haga duchas vaginales ni use tampones o toallas higinicas perfumadas.  No mantenga las piernas cruzadas durante [place] Beth[place]o tiempo.  Evite el contacto con las bandejas sanitarias de los gatos y la tierra que estos animales usan. Estos elementos contienen bacterias que pueden causar defectos congnitos al beb y la posible prdida del feto debido a un aborto espontneo o muerte fetal.  No fume, no consuma hierbas ni medicamentos que no hayan sido recetados por el mdico. Las sustancias qumicas que estos productos contienen afectan la formacin y el desarrollo del beb.  No consuma ningn producto que contenga tabaco, lo que incluye cigarrillos, tabaco de Theatre managermascar y  Administrator, Civil Servicecigarrillos electrnicos. Si necesita ayuda para dejar de fumar, consulte al American Expressmdico. Puede recibir asesoramiento y otro tipo de recursos para dejar de fumar.  Programe una cita con el dentista. En su casa, lvese los dientes con un cepillo dental blando y psese el hilo dental con suavidad. SOLICITE ATENCIN MDICA SI:   Tiene mareos.  Siente clicos leves, presin en la pelvis o dolor persistente en el abdomen.  Tiene nuseas, vmitos o diarrea persistentes.  Tiene secrecin vaginal con mal olor.  Siente dolor al ConocoPhillipsorinar.  Tiene el rostro, las Platte Woodsmanos, las piernas o los tobillos ms hinchados. SOLICITE ATENCIN MDICA DE INMEDIATO SI:   Tiene fiebre.  Tiene una prdida de lquido por la vagina.  Tiene sangrado o pequeas prdidas vaginales.  Siente dolor intenso o clicos en el abdomen.  Sube o baja de peso rpidamente.  Vomita sangre de color rojo brillante o material que parezca granos de caf.  Ha estado expuesta a la rubola y no ha sufrido la enfermedad.  Ha estado expuesta a la quinta enfermedad o a la varicela.  Tiene un dolor de cabeza intenso.  Le falta el aire.  Sufre cualquier tipo de traumatismo, por ejemplo, debido a una cada o un accidente automovilstico.   Esta informacin no tiene Theme park manager el consejo del mdico. Asegrese de hacerle al mdico cualquier pregunta que tenga.   Document Released: 08/06/2005 Document Revised: 11/17/2014 Elsevier Interactive Patient Education [employer]

## 2016-01-30 NOTE — ED Notes (Signed)
Pt discharged to home.  Family member driving.  Discharge instructions reviewed.  Verbalized understanding.  No questions or concerns at this time.  Teach back verified.  Pt in NAD.  No items left in ED.   

## 2016-04-28 ENCOUNTER — Encounter: Payer: Self-pay | Admitting: Emergency Medicine

## 2016-04-28 ENCOUNTER — Emergency Department: Payer: Self-pay

## 2016-04-28 ENCOUNTER — Emergency Department
Admission: EM | Admit: 2016-04-28 | Discharge: 2016-04-29 | Disposition: A | Discharge status: Home / Self Care | Payer: Self-pay | Attending: Emergency Medicine | Admitting: Emergency Medicine

## 2016-04-28 DIAGNOSIS — Z3A21 21 weeks gestation of pregnancy: Secondary | ICD-10-CM | POA: Insufficient documentation

## 2016-04-28 DIAGNOSIS — O2202 Varicose veins of lower extremity in pregnancy, second trimester: Secondary | ICD-10-CM | POA: Insufficient documentation

## 2016-04-28 DIAGNOSIS — M79606 Pain in leg, unspecified: Secondary | ICD-10-CM

## 2016-04-28 DIAGNOSIS — Z79899 Other long term (current) drug therapy: Secondary | ICD-10-CM | POA: Insufficient documentation

## 2016-04-28 DIAGNOSIS — O2200 Varicose veins of lower extremity in pregnancy, unspecified trimester: Secondary | ICD-10-CM

## 2016-04-28 DIAGNOSIS — O1202 Gestational edema, second trimester: Secondary | ICD-10-CM

## 2016-04-28 LAB — URINALYSIS COMPLETE WITH MICROSCOPIC (ARMC ONLY)
BILIRUBIN URINE: NEGATIVE
GLUCOSE, UA: NEGATIVE mg/dL
Hgb urine dipstick: NEGATIVE
KETONES UR: NEGATIVE mg/dL
Leukocytes, UA: NEGATIVE
NITRITE: NEGATIVE
Protein, ur: NEGATIVE mg/dL
RBC / HPF: NONE SEEN RBC/hpf (ref 0–5)
SPECIFIC GRAVITY, URINE: 1.026 (ref 1.005–1.030)
pH: 5 (ref 5.0–8.0)

## 2016-04-28 NOTE — ED Notes (Signed)
Patient complains of increased pain and swelling to left leg for last 3 days. Obvious vericose veins. Patient states they are getting worse and are tender. States that pain sometimes goes up into left flank area also.

## 2016-04-28 NOTE — ED Notes (Signed)
Pt arrived to the ED accompanied by her husband for complaints of left leg pain. Pt states that she is [redacted] weeks pregnant and has been experiencing leg swelling and pain for the last 3 days. Pt is AOx4 in no apparent distress with no noticeable leg swelling in triage.

## 2016-04-29 NOTE — Discharge Instructions (Signed)
Venas varicosas (Varicose Veins) Las venas varicosas son venas que se han agrandado y tornado sinuosas. Suelen aparecer [employer] piernas, pero tambin pueden verse en otra parte del cuerpo. CAUSAS Esta afeccin se presenta como consecuencia del mal funcionamiento de las vlvulas de las venas, las cuales ayudan al retorno de la sangre desde las piernas hacia el corazn. Si estas vlvulas se daan, la sangre retrocede y regresa a las venas de la pierna, cerca de la superficie de la piel, lo que causa dilatacin venosa. FACTORES DE RIESGO Las personas que estn mucho tiempo paradas, las embarazadas o las personas con sobrepeso tienen ms probabilidades de tener venas varicosas. SIGNOS Y SNTOMAS  Venas abultadas, azuladas y de aspecto sinuoso que se observan con mayor frecuencia en las piernas.  Dolor o sensacin de Development worker, community las piernas. Estos sntomas pueden empeorar al final del da.  Hinchazn de las piernas.  Cambios en el color de la piel. DIAGNSTICO Generalmente, el mdico puede diagnosticar las venas varicosas al examinarle las piernas. Adems, puede recomendarle que se haga una ecografa de las venas de las piernas. TRATAMIENTO La mayora de las venas varicosas pueden tratarse en casa. Sin embargo, hay otros tratamientos a disposicin de [employer] tienen sntomas persistentes o desean mejorar la apariencia esttica de las venas varicosas. Estas opciones de tratamiento incluyen lo siguiente:  Escleroterapia. Se inyecta una solucin en la vena para anularla.  Tratamiento con lser. Se [place] un rayo lser para calentar la vena y anularla.  Ablacin venosa por radiofrecuencia Se [place] una corriente elctrica que se produce mediante ondas de radio para anular la vena.  Flebectoma. Se extirpa quirrgicamente la vena a travs de pequeas incisiones que se hacen sobre la vena varicosa.  Ligadura venosa y varicectoma. La vena se extirpa quirrgicamente a travs de incisiones que se  realizan sobre la vena varicosa despus de haberla anudado (ligado). INSTRUCCIONES PARA EL CUIDADO EN EL HOGAR  No permanezca sentado o de pie en una posicin durante mucho tiempo. No se siente con las piernas cruzadas. Descanse con las piernas Radiation protection practitioner.  Use medias de compresin como le haya indicado su mdico. Estas medias ayudan a evitar la formacin de cogulos sanguneos y a Building services engineer de las piernas.  No use otras prendas que le ajusten todo el contorno de las piernas, la pelvis o la cintura.  Camine todo lo posible para aumentar la circulacin de Risk manager.  A la noche, eleve el pie de la cama con bloques de 2pulgadas.  Si tiene un corte en la piel sobre la vena y la vena sangra, recustese con la pierna elevada y [place] presin en el lugar con un pao limpio, hasta que deje de Geophysicist/field seismologist. Luego aplique un apsito (vendaje) sobre el corte. Consulte al mdico si el sangrado contina. SOLICITE ATENCIN MDICA SI:  La piel alrededor del tobillo empieza a Lobbyist.  Siente dolor, hay enrojecimiento, sensibilidad o hinchazn dura en la pierna sobre una vena.  Est incmodo debido al dolor de la pierna.   Esta informacin no tiene Theme park manager el consejo del mdico. Asegrese de hacerle al mdico cualquier pregunta que tenga.   Document Released: 08/06/2005 Document Revised: 11/17/2014 Elsevier Interactive Patient Education 2016 Elsevier Inc.  Terapia con calor (Heat Therapy) La terapia con calor puede ayudar a aliviar articulaciones y msculos doloridos, lesionados, tensos y rgidos. El calor [employer], lo cual puede ayudar a Engineer, materials. La terapia con calor solo se Therapist, music  en lesiones viejas, preexistentes o de larga duracin (crnicas). No use la terapia con calor a menos que se lo haya indicado el mdico. CMO USAR LA TERAPIA CON CALOR Existen varios tipos distintos de terapia con calor, como:  Compresas hmedas  calientes.  Bao de agua caliente.  Bolsa de agua caliente.  [place]adilla trmica.  Bolsa de gel caliente.  Vendaje caliente.  Almohadilla trmica. RECOMENDACIONES GENERALES PARA LA TERAPIA CON CALOR   No duerma mientras [place] la terapia con calor. Utilice la terapia con calor solo mientras est despierto.  La piel puede volverse rosada mientras [place] la terapia con calor. No use la terapia con calor si la piel se pone roja.  No use la terapia con calor si siente un dolor nuevo.  Una temperatura muy alta o una exposicin prolongada al calor puede causar quemaduras. Sea cauto con la terapia de calor para evitar quemar la piel.  No use la terapia con calor en zonas de la piel que ya estn irritadas, como con una erupcin o una quemadura de sol. SOLICITE AYUDA SI:   Observa ampollas, enrojecimiento, hinchazn o adormecimiento.  Siente un dolor nuevo.  El dolor [place]ls[place]heempeora. ASEGRESE DE QUE:  Comprende estas instrucciones.  Controlar su afeccin.  Recibir ayuda de inmediato si no mejora o si empeora.   Esta informacin no tiene Theme park managercomo fin reemplazar el consejo del mdico. Asegrese de hacerle al mdico cualquier pregunta que tenga.   Document Released: 01/19/2012 Document Revised: 11/17/2014 Elsevier Interactive Patient Education 2016 [employer]   Take Tylenol for pain relief. Rest with your feet propped up when sitting down. [facility] for [facility] prenatal care.  ######################################################################################################################################################   Tome el Tylenol para Engineer, materialsaliviar el dolor. Descanse con los pies apoyados cuando se sienta. Seguimiento con Engineer, manufacturing systemsel Departamento de Salud del Ridge[place]ondado de  para el cuidado prenatal de rutina.

## 2016-04-29 NOTE — ED Provider Notes (Signed)
[facility] Emergency Department Provider Note ____________________________________________  Time seen: 2245  I have reviewed the triage vital signs and the nursing notes.  HISTORY  Chief Complaint  Leg Pain   Exam limited by Spanish language. Interpreter (IKerin Salen)  HPI Colleen Thomas is a 34 y.o. female presents to the ED for evaluation of pain and swelling to the left leg for the last 3 days. The patient is [redacted] weeks pregnant at this time and has been experiencing increased pain and tenderness to the varicose veins on her left calf and left lateral ankle. She denies any injury, accident, or trauma. She has taken Tylenol without significant or lasting benefit. She has not started her prenatal care in earnest with this, her sixth pregnancy.She denies any fevers, chills, shortness of breath, chest pain, or recent illness. She also denies any prolonged sitting or recent travel. She otherwise is without any increased urination, thirst, or excessive weight gain. She describes her pain at 8/10 in triage and describes it as aching and throbbing in nature. She denies any complaints or concerns related to the pregnancy.  Past Medical History  Diagnosis Date  . AMA (advanced maternal age) multigravida 35+   . Gestational diabetes     glyburide    Patient Active Problem List   Diagnosis Date Noted  . Active labor 12/08/2014  . [redacted] weeks gestation of pregnancy   . Gestational diabetes mellitus, currently pregnant 10/09/2014  . AMA (advanced maternal age) multigravida 35+ 10/09/2014  . Late prenatal care 10/09/2014  . Illiteracy 10/09/2014  . 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 Cholecystectomy      Current Outpatient Rx  Name  Route  Sig  Dispense  Refill  . nitrofurantoin, macrocrystal-monohydrate, (MACROBID) 100 MG capsule   Oral   Take 1 capsule (100 mg total) by mouth 2  (two) times daily.   14 capsule   0   . omeprazole (PRILOSEC) 40 MG capsule   Oral   Take 1 capsule (40 mg total) by mouth daily.   30 capsule   1   . ondansetron (ZOFRAN ODT) 4 MG disintegrating tablet   Oral   Take 1 tablet (4 mg total) by mouth every 8 (eight) hours as needed for nausea or vomiting.   20 tablet   0     Allergies Review of patient's allergies indicates no known allergies.  Family History  Problem Relation Age of Onset  . Alcohol abuse Neg Hx   . Arthritis Neg Hx   . Asthma Neg Hx   . Birth defects Neg Hx   . Cancer Neg Hx   . COPD Neg Hx   . Depression Neg Hx   . Diabetes Neg Hx   . Drug abuse Neg Hx   . Early death Neg Hx   . Hearing loss Neg Hx   . Heart disease Neg Hx   . Hyperlipidemia Neg Hx   . Hypertension Neg Hx   . Kidney disease Neg Hx   . Learning disabilities Neg Hx   . Mental illness Neg Hx   . Mental retardation Neg Hx   . Miscarriages / Stillbirths Neg Hx   . Stroke Neg Hx   . Vision loss Neg Hx   . Varicose Veins Neg Hx     Social History Social History  Substance Use Topics  . Smoking status: Never Smoker   .  Smokeless tobacco: Never Used  . Alcohol Use: No   Review of Systems  Constitutional: Negative for fever. Cardiovascular: Negative for chest pain. LLE swelling and increased varicose veins.  Respiratory: Negative for shortness of breath. Gastrointestinal: Negative for abdominal pain, vomiting and diarrhea. Genitourinary: Negative for dysuria. Musculoskeletal: Negative for back pain. Skin: Negative for rash. Neurological: Negative for headaches, focal weakness or numbness. ____________________________________________  PHYSICAL EXAM:  VITAL SIGNS: ED Triage Vitals  Enc Vitals Group     BP 04/28/16 2147 101/47 mmHg     Pulse Rate 04/28/16 2147 72     Resp 04/28/16 2147 20     Temp 04/28/16 2147 97.6 F (36.4 C)     Temp Source 04/28/16 2147 Oral     SpO2 04/28/16 2147 98 %     Weight 04/28/16 2147 143  lb 15.4 oz (65.3 kg)     Height 04/28/16 2147 5' (1.524 m)     Head Cir --      Peak Flow --      Pain Score 04/28/16 2149 8     Pain Loc --      Pain Edu? --      Excl. in GC? --     Constitutional: Alert and oriented. Well appearing and in no distress. Head: Normocephalic and atraumatic. Hematological/Lymphatic/Immunological: No cervical lymphadenopathy. Cardiovascular: Normal rate, regular rhythm. Normal distal pulses and cap refill.  Respiratory: Normal respiratory effort. No wheezes/rales/rhonchi. Gastrointestinal: Soft and nontender. No distention. Musculoskeletal: Nontender with normal range of motion in all extremities.  Neurologic:  Normal gait without ataxia. Normal speech and language. No gross focal neurologic deficits are appreciated. Skin:  Skin is warm, dry and intact. No rash noted. Patient without any significant edema noted to the bilateral lower extremities. She does note a large varicose vein anomaly superficially to the medial calf. The appearance of the vessels is torturous, edematous, and dark blue in color. She has a superficial collection of varicose veins to the lateral aspect of her left ankle. The varicose vein bed to the calf is tender to palpation. But no warmth, induration, or weeping is appreciated. ____________________________________________    LABS (pertinent positives/negatives) Labs Reviewed  URINALYSIS COMPLETEWITH MICROSCOPIC ([facility] ONLY) - Abnormal; Notable for the following:    Color, Urine YELLOW (*)    APPearance HAZY (*)    Bacteria, UA RARE (*)    Squamous Epithelial / LPF 0-5 (*)    All other components within normal limits  ____________________________________________   RADIOLOGY  LLE Ultrasound/Doppler  IMPRESSION: No evidence of left lower extremity deep vein thrombosis. ____________________________________________  INITIAL IMPRESSION / ASSESSMENT AND PLAN / ED COURSE  Patient with what appears to be increase in her lower  extremities varicose veins in the antepartum stage. She has edema to the bilateral lower extremities without significant pitting. The patient is reassured by her negative DVT study. She is advised to rest with the legs elevated when seated and consider compression hose to reduce varicosities. She will also increase fluid intake and monitor her sodium intake she [facility]. ____________________________________________  FINAL CLINICAL IMPRESSION(S) / ED DIAGNOSES  Final diagnoses:  Varicose veins during pregnancy, antepartum  Edema during pregnancy in second trimester     Lissa HoardJenise V Bacon Ahnya Akre, PA-C 04/29/16 0040  Loleta Roseory Forbach, MD 04/29/16 [id number]

## 2016-04-29 NOTE — ED Notes (Addendum)
Video Remote Interpreter used for discharge. PA at beside.

## 2016-05-07 ENCOUNTER — Other Ambulatory Visit: Payer: Self-pay | Admitting: Family Medicine

## 2016-05-07 DIAGNOSIS — O09529 Supervision of elderly multigravida, unspecified trimester: Secondary | ICD-10-CM

## 2016-05-12 ENCOUNTER — Observation Stay
Admission: EM | Admit: 2016-05-12 | Discharge: 2016-05-13 | Disposition: A | Discharge status: Home / Self Care | Payer: Medicaid Other | Attending: Obstetrics and Gynecology | Admitting: Obstetrics and Gynecology

## 2016-05-12 DIAGNOSIS — O9989 Other specified diseases and conditions complicating pregnancy, childbirth and the puerperium: Principal | ICD-10-CM | POA: Insufficient documentation

## 2016-05-12 DIAGNOSIS — Z3A24 24 weeks gestation of pregnancy: Secondary | ICD-10-CM | POA: Insufficient documentation

## 2016-05-12 DIAGNOSIS — R109 Unspecified abdominal pain: Secondary | ICD-10-CM | POA: Insufficient documentation

## 2016-05-13 LAB — URINALYSIS COMPLETE WITH MICROSCOPIC (ARMC ONLY)
Bilirubin Urine: NEGATIVE
GLUCOSE, UA: NEGATIVE mg/dL
Hgb urine dipstick: NEGATIVE
KETONES UR: NEGATIVE mg/dL
Leukocytes, UA: NEGATIVE
Nitrite: NEGATIVE
PROTEIN: NEGATIVE mg/dL
SPECIFIC GRAVITY, URINE: 1.015 (ref 1.005–1.030)
pH: 6 (ref 5.0–8.0)

## 2016-05-13 MED ORDER — HYDROCODONE-ACETAMINOPHEN 5-325 MG PO TABS
1.0000 | ORAL_TABLET | Freq: Once | ORAL | Status: AC | PRN
  Administered 2016-05-13: 1 via ORAL

## 2016-05-13 NOTE — OB Triage Note (Signed)
Pt presents to L&D with c/o left sided pain going to back since 10 am. Denies leaking of fluid or vaginal bleeding. EFM applied and explained, UA obtained. Plan to monitor fetal and maternal well being and assess pt complaint.

## 2016-05-13 NOTE — Discharge Instructions (Signed)
Call provider or return to birthplace with: ? ?1. Regular contractions ?2. Leaking of fluid from your vagina ?3. Vaginal bleeding: Bright red or heavy like a period ?4. Decreased Fetal movement  ?

## 2016-05-13 NOTE — Discharge Summary (Signed)
  Patient ID: Colleen ParishMaria J Perez Thomas, female   DOB: [birth year], 35 y.o.   MRN: [id number] Pt was observed last pm with mild abd pain . [redacted] weeks pregnant .  Afebrile  CX closed by RN  Reassuring fetal monitoring for gestational age  ua negative . Pain better( pt was sleeping )  D/c home with 5/ 325 mg norco  Culture pending

## 2016-05-14 LAB — CULTURE, OB URINE

## 2016-05-26 ENCOUNTER — Ambulatory Visit
Admission: RE | Admit: 2016-05-26 | Discharge: 2016-05-26 | Disposition: A | Discharge status: Home / Self Care | Payer: Medicaid Other | Source: Ambulatory Visit | Attending: Obstetrics & Gynecology | Admitting: Obstetrics & Gynecology

## 2016-05-26 DIAGNOSIS — O09529 Supervision of elderly multigravida, unspecified trimester: Secondary | ICD-10-CM | POA: Insufficient documentation

## 2016-05-26 DIAGNOSIS — Z3A26 26 weeks gestation of pregnancy: Secondary | ICD-10-CM | POA: Insufficient documentation

## 2016-05-26 DIAGNOSIS — O0932 Supervision of pregnancy with insufficient antenatal care, second trimester: Secondary | ICD-10-CM | POA: Insufficient documentation

## 2016-05-26 DIAGNOSIS — O0930 Supervision of pregnancy with insufficient antenatal care, unspecified trimester: Secondary | ICD-10-CM

## 2016-08-04 ENCOUNTER — Ambulatory Visit
Admission: RE | Admit: 2016-08-04 | Discharge: 2016-08-04 | Disposition: A | Discharge status: Home / Self Care | Payer: Medicaid Other | Source: Ambulatory Visit | Attending: Obstetrics & Gynecology | Admitting: Obstetrics & Gynecology

## 2016-08-04 ENCOUNTER — Other Ambulatory Visit: Payer: Self-pay | Admitting: Obstetrics & Gynecology

## 2016-08-04 DIAGNOSIS — O09523 Supervision of elderly multigravida, third trimester: Secondary | ICD-10-CM | POA: Insufficient documentation

## 2016-08-04 DIAGNOSIS — Z3A36 36 weeks gestation of pregnancy: Secondary | ICD-10-CM | POA: Insufficient documentation

## 2016-08-10 IMAGING — CT CT ABD-PELV W/ CM
1 of 2 series · 15 of 32 positions shown, 19 images · IV contrast (omnipaque)
Comparison: August 09, 2013

CLINICAL DATA: Three-day history of lower abdominal pain and nausea

EXAM:
CT ABDOMEN AND PELVIS WITH CONTRAST
TECHNIQUE: Multidetector CT imaging of the abdomen and pelvis was performed
using the standard protocol following bolus administration of
intravenous contrast. Oral contrast was also administered.
CONTRAST:  100mL OMNIPAQUE IOHEXOL 300 MG/ML  SOLN

[Series 2: routine abd pel with · axial · 0.63mm/px · z∈[+369,+784]mm · 15 of 91 slices shown, 19 images]
[im 4/91  soft-tissue]
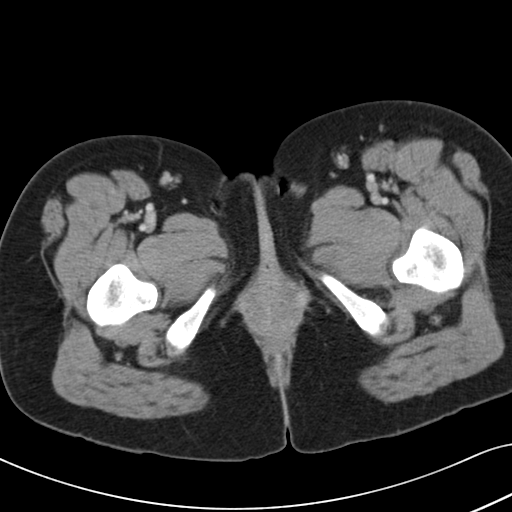
[im 4/91  bone]
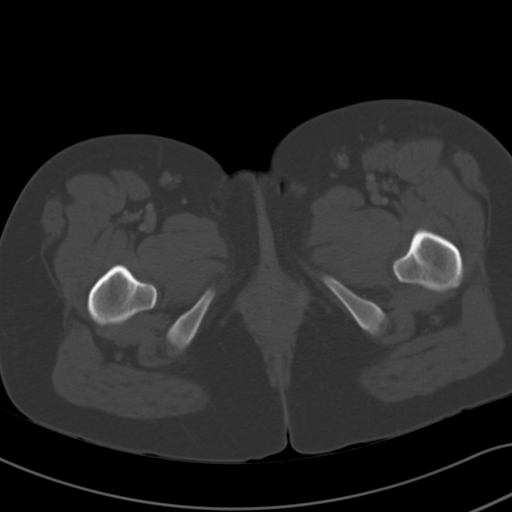
[im 11/91  soft-tissue]
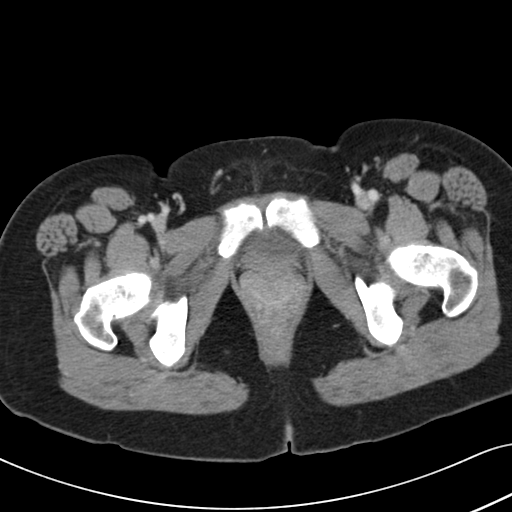
[im 18/91  soft-tissue]
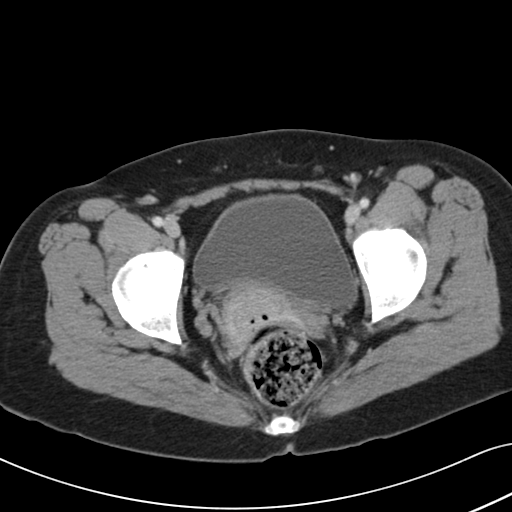
[im 25/91  soft-tissue]
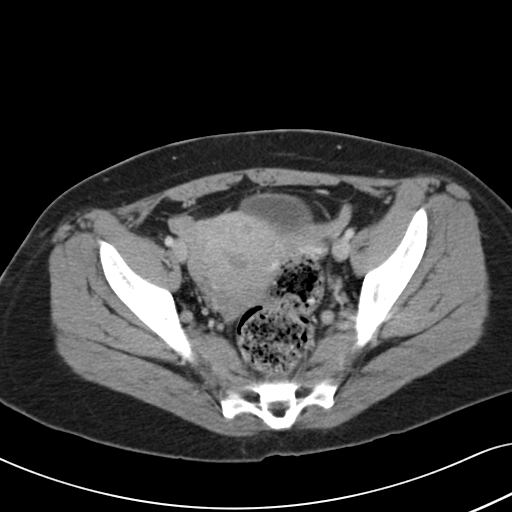
[im 32/91  soft-tissue]
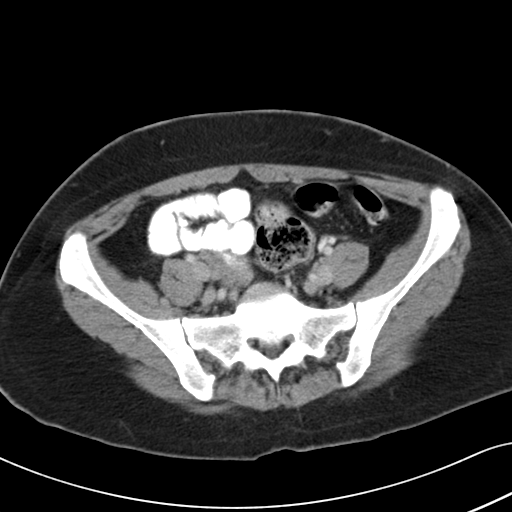
[im 39/91  soft-tissue]
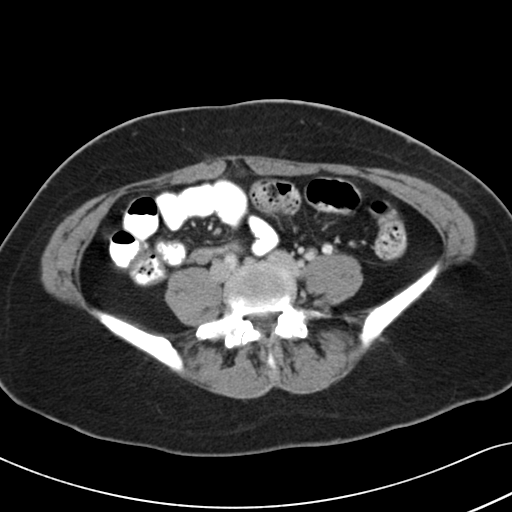
[im 46/91  soft-tissue]
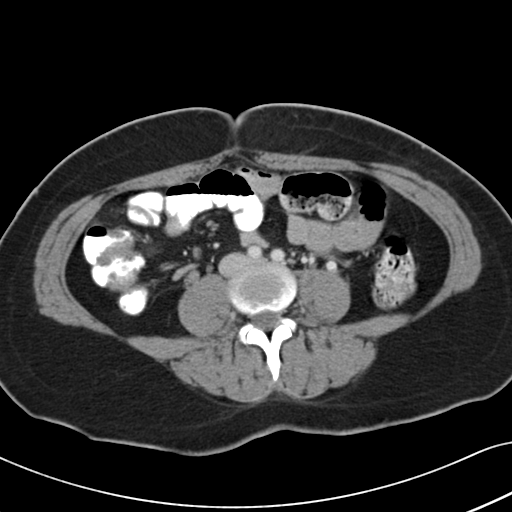
[im 52/91  soft-tissue]
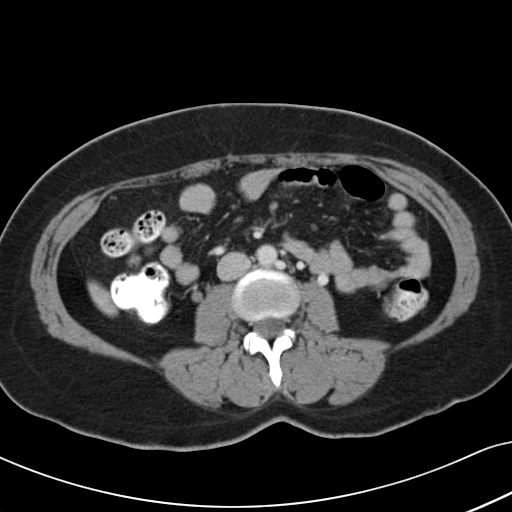
[im 59/91  soft-tissue]
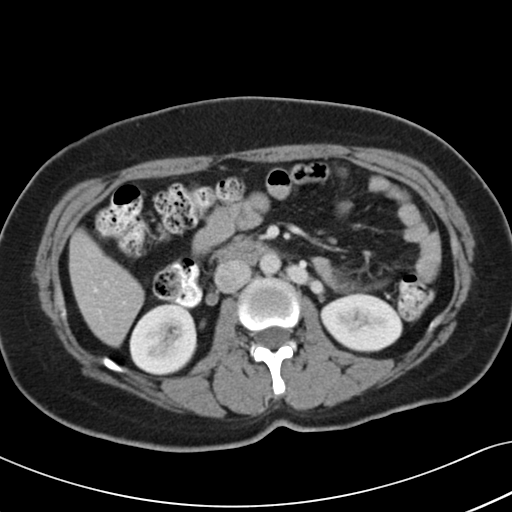
[im 59/91  bone]
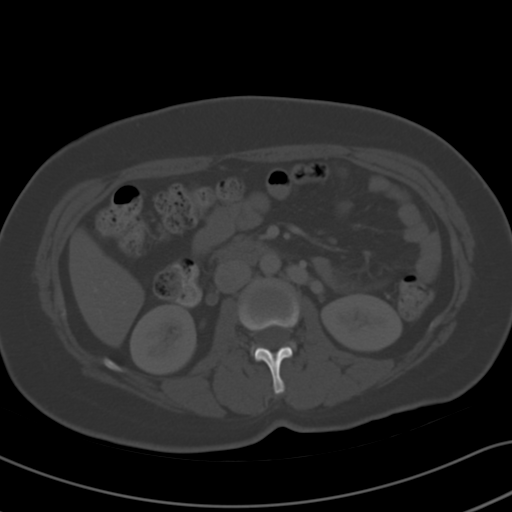
[im 66/91  soft-tissue]
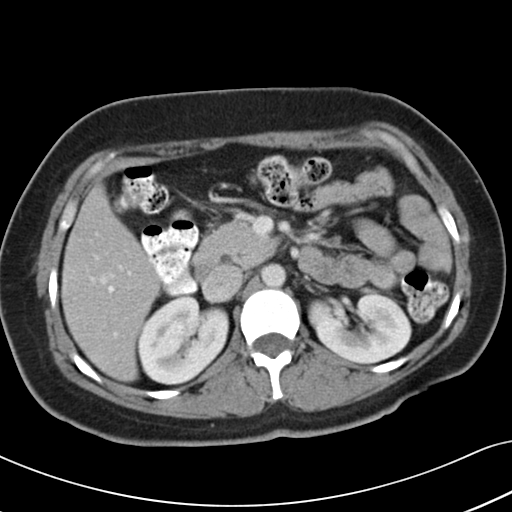
[im 73/91  soft-tissue]
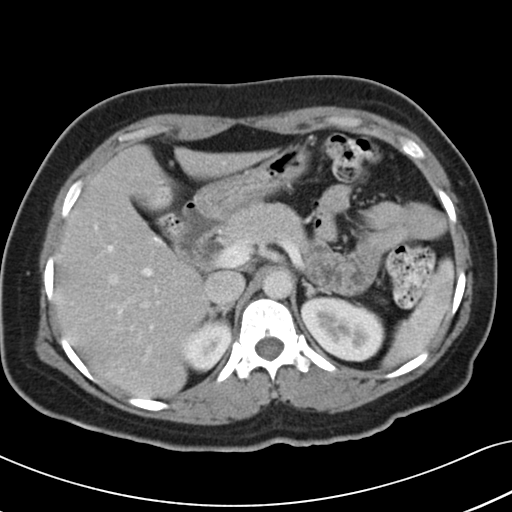
[im 77/91  lung]
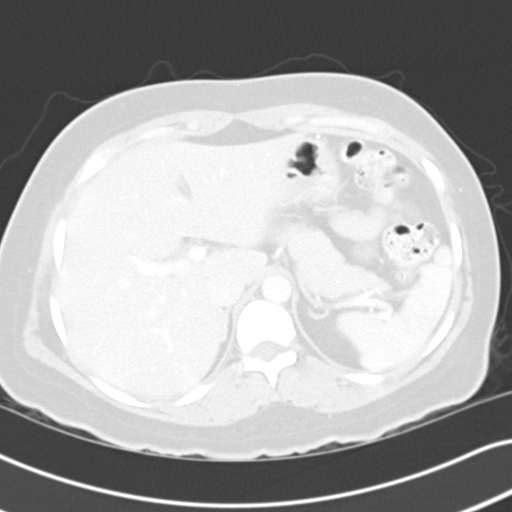
[im 80/91  soft-tissue]
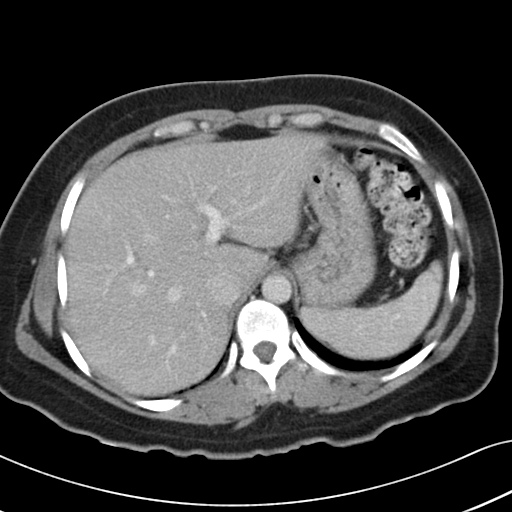
[im 80/91  lung]
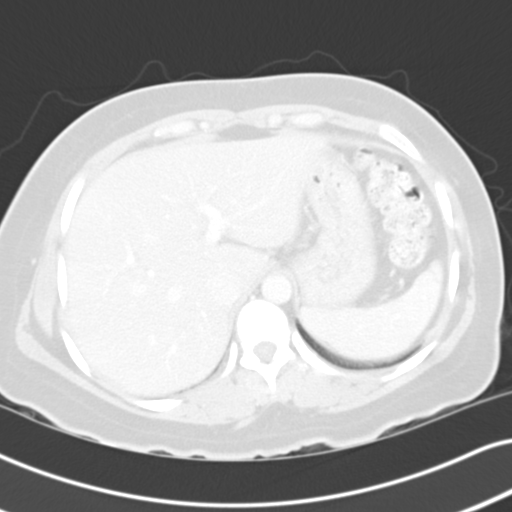
[im 84/91  lung]
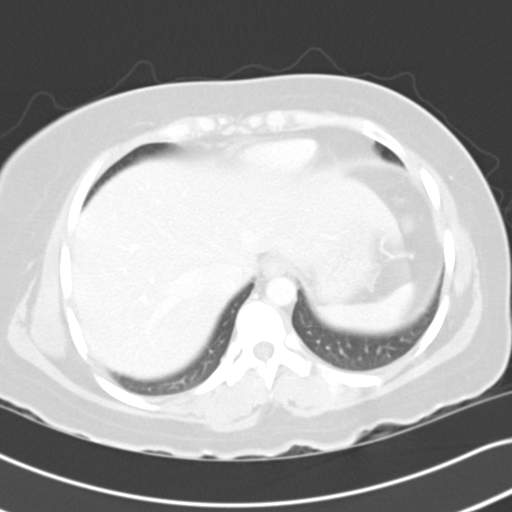
[im 87/91  soft-tissue]
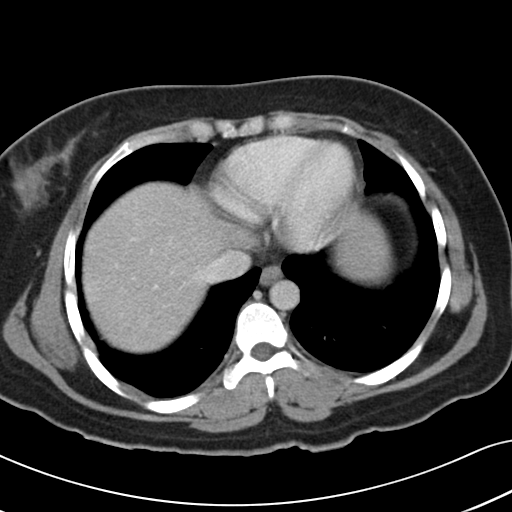
[im 87/91  lung]
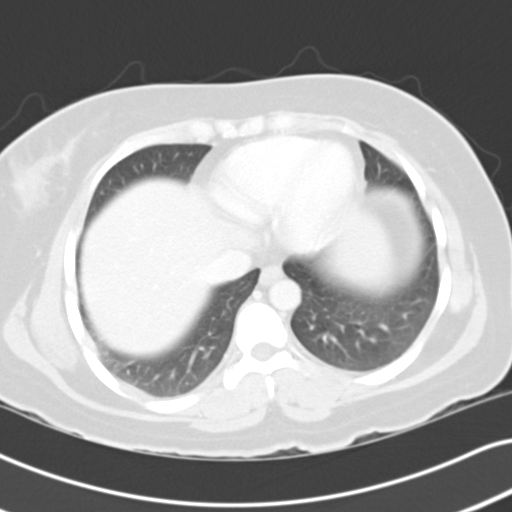

[15 of 32 positions shown; findings below may reference images not displayed]

FINDINGS: Lung bases are clear.

Liver is prominent, measuring 19.2 cm in length. No focal liver
lesions are identified. The gallbladder is absent. There is no
intrahepatic biliary duct dilatation. The distal common bile duct
measures 9 mm which may be within normal limits given the post
cholecystectomy state. No biliary duct mass or calculus is
appreciable by CT.

Spleen, pancreas, and adrenals appear normal. Kidneys bilaterally
show no mass or hydronephrosis on either side. There is no renal or
ureteral calculus on either side.

In the pelvis, the urinary bladder is midline with wall thickness
within normal limits. Uterus is anteverted. There is a 7 x 7 mm
focus of decreased attenuation in the anterior uterus which may
represent a small leiomyoma. No other pelvic mass is seen. There is
no pelvic fluid collection. The rectum is slightly distended with
stool without appreciable rectal wall thickening.

Appendix appears normal.  Terminal ileum appears normal.

There is no bowel obstruction. No free air or portal venous air.
There is no demonstrable ascites, adenopathy, or abscess in the
abdomen or pelvis. There is no abdominal aortic aneurysm. There is a
retroaortic left renal vein, an anatomic variant. There are no
blastic or lytic bone lesions.
IMPRESSION: No bowel obstruction. No abscess. Appendix appears normal. No bowel
wall or mesenteric thickening.

Liver prominent without focal lesion. Gallbladder absent. The common
bile duct measures 9 mm which may be within normal limits given the
post cholecystectomy state.

Probable subcentimeter uterine leiomyoma anteriorly.

No renal or ureteral calculus.  No hydronephrosis.

## 2016-08-19 ENCOUNTER — Inpatient Hospital Stay: Payer: Medicaid Other | Admitting: Anesthesiology

## 2016-08-19 ENCOUNTER — Inpatient Hospital Stay
Admission: EM | Admit: 2016-08-19 | Discharge: 2016-08-20 | DRG: 775 | Disposition: A | Discharge status: Home / Self Care | Payer: Medicaid Other | Attending: Obstetrics and Gynecology | Admitting: Obstetrics and Gynecology

## 2016-08-19 DIAGNOSIS — O479 False labor, unspecified: Secondary | ICD-10-CM | POA: Diagnosis present

## 2016-08-19 DIAGNOSIS — Z3483 Encounter for supervision of other normal pregnancy, third trimester: Secondary | ICD-10-CM | POA: Diagnosis present

## 2016-08-19 DIAGNOSIS — Z3A38 38 weeks gestation of pregnancy: Secondary | ICD-10-CM

## 2016-08-19 HISTORY — DX: Anemia, unspecified: D64.9

## 2016-08-19 LAB — CBC
HCT: 32.4 % — ABNORMAL LOW (ref 35.0–47.0)
HEMOGLOBIN: 10.9 g/dL — AB (ref 12.0–16.0)
MCH: 28 pg (ref 26.0–34.0)
MCHC: 33.8 g/dL (ref 32.0–36.0)
MCV: 82.9 fL (ref 80.0–100.0)
Platelets: 228 10*3/uL (ref 150–440)
RBC: 3.9 MIL/uL (ref 3.80–5.20)
RDW: 19.6 % — ABNORMAL HIGH (ref 11.5–14.5)
WBC: 5.8 10*3/uL (ref 3.6–11.0)

## 2016-08-19 LAB — TYPE AND SCREEN
ABO/RH(D): O POS
Antibody Screen: NEGATIVE

## 2016-08-19 MED ORDER — ONDANSETRON HCL 4 MG/2ML IJ SOLN: 4.0000 mg | INTRAMUSCULAR | Status: DC | PRN

## 2016-08-19 MED ORDER — FERROUS SULFATE 325 (65 FE) MG PO TABS
325.0000 mg | ORAL_TABLET | Freq: Two times a day (BID) | ORAL | Status: DC
  Administered 2016-08-19 – 2016-08-20 (×2): 325 mg via ORAL
  Filled 2016-08-19 (×2): qty 1

## 2016-08-19 MED ORDER — LIDOCAINE-EPINEPHRINE (PF) 1.5 %-1:200000 IJ SOLN
INTRAMUSCULAR | Status: DC | PRN
  Administered 2016-08-19: 3 mL via PERINEURAL

## 2016-08-19 MED ORDER — OXYTOCIN BOLUS FROM INFUSION: 500.0000 mL | Freq: Once | INTRAVENOUS | Status: DC

## 2016-08-19 MED ORDER — AMMONIA AROMATIC IN INHA
RESPIRATORY_TRACT | Status: AC
  Filled 2016-08-19: qty 10

## 2016-08-19 MED ORDER — OXYTOCIN 40 UNITS IN LACTATED RINGERS INFUSION - SIMPLE MED
2.5000 [IU]/h | INTRAVENOUS | Status: DC
  Administered 2016-08-19: 39.96 [IU]/h via INTRAVENOUS
  Filled 2016-08-19: qty 1000

## 2016-08-19 MED ORDER — ACETAMINOPHEN 325 MG PO TABS
650.0000 mg | ORAL_TABLET | ORAL | Status: DC | PRN
  Filled 2016-08-19: qty 2

## 2016-08-19 MED ORDER — LACTATED RINGERS IV SOLN: 500.0000 mL | INTRAVENOUS | Status: DC | PRN

## 2016-08-19 MED ORDER — MISOPROSTOL 200 MCG PO TABS
ORAL_TABLET | ORAL | Status: AC
  Filled 2016-08-19: qty 4

## 2016-08-19 MED ORDER — IBUPROFEN 600 MG PO TABS
600.0000 mg | ORAL_TABLET | Freq: Four times a day (QID) | ORAL | Status: DC
  Administered 2016-08-19 – 2016-08-20 (×5): 600 mg via ORAL
  Filled 2016-08-19 (×5): qty 1

## 2016-08-19 MED ORDER — DIPHENHYDRAMINE HCL 25 MG PO CAPS: 25.0000 mg | ORAL_CAPSULE | Freq: Four times a day (QID) | ORAL | Status: DC | PRN

## 2016-08-19 MED ORDER — DIBUCAINE 1 % RE OINT: 1.0000 "application " | TOPICAL_OINTMENT | RECTAL | Status: DC | PRN

## 2016-08-19 MED ORDER — COCONUT OIL OIL: 1.0000 "application " | TOPICAL_OIL | Status: DC | PRN

## 2016-08-19 MED ORDER — BUTORPHANOL TARTRATE 1 MG/ML IJ SOLN: 1.0000 mg | INTRAMUSCULAR | Status: DC | PRN

## 2016-08-19 MED ORDER — SENNOSIDES-DOCUSATE SODIUM 8.6-50 MG PO TABS: 2.0000 | ORAL_TABLET | ORAL | Status: DC

## 2016-08-19 MED ORDER — PRENATAL MULTIVITAMIN CH
1.0000 | ORAL_TABLET | Freq: Every day | ORAL | Status: DC
  Administered 2016-08-19 – 2016-08-20 (×2): 1 via ORAL
  Filled 2016-08-19 (×2): qty 1

## 2016-08-19 MED ORDER — FENTANYL 2.5 MCG/ML W/ROPIVACAINE 0.2% IN NS 100 ML EPIDURAL INFUSION (ARMC-ANES)
EPIDURAL | Status: DC | PRN
  Administered 2016-08-19: 9 mL/h via EPIDURAL

## 2016-08-19 MED ORDER — OXYCODONE-ACETAMINOPHEN 5-325 MG PO TABS
ORAL_TABLET | ORAL | Status: AC
  Filled 2016-08-19: qty 1

## 2016-08-19 MED ORDER — SIMETHICONE 80 MG PO CHEW: 80.0000 mg | CHEWABLE_TABLET | ORAL | Status: DC | PRN

## 2016-08-19 MED ORDER — ACETAMINOPHEN 325 MG PO TABS
650.0000 mg | ORAL_TABLET | ORAL | Status: DC | PRN
  Administered 2016-08-19 (×2): 650 mg via ORAL
  Filled 2016-08-19: qty 2

## 2016-08-19 MED ORDER — ONDANSETRON HCL 4 MG/2ML IJ SOLN: 4.0000 mg | Freq: Four times a day (QID) | INTRAMUSCULAR | Status: DC | PRN

## 2016-08-19 MED ORDER — MAGNESIUM HYDROXIDE 400 MG/5ML PO SUSP: 30.0000 mL | ORAL | Status: DC | PRN

## 2016-08-19 MED ORDER — MEASLES, MUMPS & RUBELLA VAC ~~LOC~~ INJ
0.5000 mL | INJECTION | Freq: Once | SUBCUTANEOUS | Status: DC
  Filled 2016-08-19: qty 0.5

## 2016-08-19 MED ORDER — OXYTOCIN 10 UNIT/ML IJ SOLN
INTRAMUSCULAR | Status: AC
  Filled 2016-08-19: qty 2

## 2016-08-19 MED ORDER — FENTANYL 2.5 MCG/ML W/ROPIVACAINE 0.2% IN NS 100 ML EPIDURAL INFUSION (ARMC-ANES)
EPIDURAL | Status: AC
  Filled 2016-08-19: qty 100

## 2016-08-19 MED ORDER — LACTATED RINGERS IV SOLN
INTRAVENOUS | Status: DC
Start: 2016-08-19 — End: 2016-08-19
  Administered 2016-08-19: 10:00:00 via INTRAVENOUS

## 2016-08-19 MED ORDER — ONDANSETRON HCL 4 MG PO TABS
4.0000 mg | ORAL_TABLET | ORAL | Status: DC | PRN
Start: 2016-08-19 — End: 2016-08-20

## 2016-08-19 MED ORDER — BENZOCAINE-MENTHOL 20-0.5 % EX AERO
1.0000 "application " | INHALATION_SPRAY | CUTANEOUS | Status: DC | PRN
  Administered 2016-08-19: 1 via TOPICAL
  Filled 2016-08-19: qty 56

## 2016-08-19 MED ORDER — LIDOCAINE HCL (PF) 1 % IJ SOLN
30.0000 mL | INTRAMUSCULAR | Status: AC | PRN
  Administered 2016-08-19: 3 mL via SUBCUTANEOUS

## 2016-08-19 MED ORDER — METHYLERGONOVINE MALEATE 0.2 MG/ML IJ SOLN
INTRAMUSCULAR | Status: AC
  Administered 2016-08-19: 0.2 mg
  Filled 2016-08-19: qty 1

## 2016-08-19 MED ORDER — ZOLPIDEM TARTRATE 5 MG PO TABS: 5.0000 mg | ORAL_TABLET | Freq: Every evening | ORAL | Status: DC | PRN

## 2016-08-19 MED ORDER — LIDOCAINE HCL (PF) 1 % IJ SOLN
INTRAMUSCULAR | Status: AC
  Filled 2016-08-19: qty 30

## 2016-08-19 MED ORDER — WITCH HAZEL-GLYCERIN EX PADS: 1.0000 "application " | MEDICATED_PAD | CUTANEOUS | Status: DC | PRN

## 2016-08-19 MED ORDER — BUPIVACAINE HCL (PF) 0.25 % IJ SOLN
INTRAMUSCULAR | Status: DC | PRN
  Administered 2016-08-19: 5 mL via EPIDURAL
  Administered 2016-08-19: 3 mL via EPIDURAL

## 2016-08-19 MED ORDER — SOD CITRATE-CITRIC ACID 500-334 MG/5ML PO SOLN
30.0000 mL | ORAL | Status: DC | PRN
  Filled 2016-08-19: qty 30

## 2016-08-19 NOTE — H&P (Signed)
Colleen Thomas is G6P5 at 38+6 weeks  a 35 y.o. female presenting for active ctx. OB History    Gravida Para Term Preterm AB Living   6 5 5     5    SAB TAB Ectopic Multiple Live Births         0 5      Past Surgical History:  Procedure Laterality Date  . CHOLECYSTECTOMY    equivocal  GDM , no meds  Family History: family history is not on file. Social History:  reports that she has never smoked. She has never used smokeless tobacco. She reports that she does not drink alcohol or use drugs.     Maternal Diabetes: No Genetic Screening:no record Maternal Ultrasounds/Referrals: Normal Fetal Ultrasounds or other Referrals:  None Maternal Substance Abuse:  No Significant Maternal Medications:  None Significant Maternal Lab Results:  None Other Comments:  None  ROS History Dilation: 4 Effacement (%): 80 Exam by:: sca Blood pressure 122/85, pulse 67, temperature 97.9 F (36.6 C), temperature source Oral, resp. rate 18, height 5' (1.524 m), weight 147 lb (66.7 kg), last menstrual period 12/06/2015, not currently breastfeeding.   exam by TJS : AROM bloody with clear fluid . Cx 5 cm / c / 0 vtx Exam   NST FHR 120, + accels , no decels , irregular ctx Physical Exam  Lungs CTA  CV RRR  adb soft NT  Prenatal labs: ABO, Rh:  O+ Antibody:  neg Rubella:  IMM RPR:   neg HBsAg:   neg HIV:   neg GBS:   Neg   Assessment/Plan: [place] in active labor  Anticipate svd Reassuring fetal monitoring    Colleen Thomas 08/19/2016, 9:05 AM

## 2016-08-19 NOTE — Anesthesia Preprocedure Evaluation (Signed)
Anesthesia Evaluation  Patient identified by MRN, date of birth, ID band  Reviewed: Allergy & Precautions, H&P , NPO status , Patient's Chart, lab work & pertinent test results  Airway Mallampati: II  TM Distance: <3 FB Neck ROM: full    Dental no notable dental hx.    Pulmonary neg pulmonary ROS,    Pulmonary exam normal        Cardiovascular negative cardio ROS Normal cardiovascular exam     Neuro/Psych    GI/Hepatic negative GI ROS, Neg liver ROS,   Endo/Other  negative endocrine ROSdiabetes  Renal/GU negative Renal ROS  negative genitourinary   Musculoskeletal   Abdominal   Peds  Hematology negative hematology ROS (+)   Anesthesia Other Findings   Reproductive/Obstetrics (+) Pregnancy                             Anesthesia Physical Anesthesia Plan  ASA: II  Anesthesia Plan: Epidural   Post-op Pain Management:    Induction:   Airway Management Planned:   Additional Equipment:   Intra-op Plan:   Post-operative Plan:   Informed Consent: I have reviewed the patients History and Physical, chart, labs and discussed the procedure including the risks, benefits and alternatives for the proposed anesthesia with the patient or authorized representative who has indicated his/her understanding and acceptance.     Plan Discussed with: Anesthesiologist and CRNA  Anesthesia Plan Comments:         Anesthesia Quick Evaluation

## 2016-08-19 NOTE — OB Triage Note (Signed)
Pt  Presents to L&D with c/o abd paina nd back pain since 7pm with contractions q15 min. Pt denies LOF, vaginal bleeding and reports good fetal movement. EFM applied and explained, plan to monitor fetal and maternal well being and assess for labor.

## 2016-08-19 NOTE — Discharge Summary (Signed)
Obstetric Discharge Summary Reason for Admission: onset of labor Prenatal Procedures: none Intrapartum Procedures: spontaneous vaginal delivery Postpartum Procedures: none Complications-Operative and Postpartum: none  ABO, Rh:  O+ Antibody:  neg Rubella:  IMM RPR:   neg HBsAg:   neg HIV:   neg GBS:   Neg   Hemoglobin  Date Value Ref Range Status  08/19/2016 10.9 (L) 12.0 - 16.0 g/dL Final  16/10/960409/06/2014 54.011.5 g/dL Final   HCT  Date Value Ref Range Status  08/19/2016 32.4 (L) 35.0 - 47.0 % Final  07/18/2014 36 % Final   HCT 32  today Physical Exam:  General: A,A & O x 3 Lochia:mod, no clots Uterine Fundus:U-3 DVT Evaluation: Neg Homans  Discharge Diagnoses: Term NSVD of viable female infant  Discharge Information: Date: 08/19/2016 Activity: Up ad lib Diet:Reg Medications:Fe, PNV,Ibuprofen Condition: Stable  Instructions: FU in 6 weeks, will discuss BC at 6 weeks Discharge to: Home   Newborn Data: Live born maleBirth Weight:   APGAR: , 8/9 delivery 1120 on 08/19/2016  Home with Mom  SCHERMERHORN,THOMAS 08/19/2016, 11:34 AM

## 2016-08-20 LAB — CBC
HCT: 32 % — ABNORMAL LOW (ref 35.0–47.0)
Hemoglobin: 10.6 g/dL — ABNORMAL LOW (ref 12.0–16.0)
MCH: 28.3 pg (ref 26.0–34.0)
MCHC: 33.3 g/dL (ref 32.0–36.0)
MCV: 85.1 fL (ref 80.0–100.0)
PLATELETS: 229 10*3/uL (ref 150–440)
RBC: 3.76 MIL/uL — AB (ref 3.80–5.20)
RDW: 20.1 % — AB (ref 11.5–14.5)
WBC: 8.1 10*3/uL (ref 3.6–11.0)

## 2016-08-20 LAB — RPR: RPR Ser Ql: NONREACTIVE

## 2016-08-20 MED ORDER — VARICELLA VIRUS VACCINE LIVE 1350 PFU/0.5ML IJ SUSR
0.5000 mL | Freq: Once | INTRAMUSCULAR | Status: DC
  Filled 2016-08-20: qty 0.5

## 2016-08-20 NOTE — Progress Notes (Signed)
Patient understands all discharge instructions and the need to make follow up appointments. Patient discharge via wheelchair with auxillary. 

## 2016-08-20 NOTE — Progress Notes (Signed)
Using interpreter, reeducated mom on the importance of feeding baby every 3-4 hours and recording all feedings. Mom stated baby was sleepy and would not eat. Reeducated mom on appropriate baby temperature and clothing. The temperature in the room was also adjusted to 75 degrees after noticing it was set on 80 to help wake baby. Baby was fed 10mL of formula by RN and asked mom to call out for help if baby continued to have problems feeding.

## 2016-08-20 NOTE — Progress Notes (Signed)
Post Partum Day 1 Subjective: No complaints, wants to go home today  Objective: Blood pressure (!) 87/54, pulse (!) 59, temperature 97.7 F (36.5 C), temperature source Oral, resp. rate 18, height 5' (1.524 m), weight 147 lb (66.7 kg), last menstrual period 12/06/2015, SpO2 99 %, unknown if currently breastfeeding.  Physical Exam:  General: A,A& O x 3 Lochia:Mod, no clots Uterine Fundus: U-3.  Intact Perineum, No hematoma DVT Evaluation: Neg Homan's    Recent Labs  08/19/16 0946 08/20/16 0531  HGB 10.9* 10.6*  HCT 32.4* 32.0*  WBC 5.8 8.1  PLT 228 229    Assessment/Plan: A: Stable PP D#1   LOS: 1 day   Sharee Pimplearon W Adolphus Hanf 08/20/2016, 9:57 AM

## 2016-08-20 NOTE — Anesthesia Procedure Notes (Signed)
Epidural Patient location during procedure: OB Start time: 08/19/2016 10:30 AM End time: 08/19/2016 11:15 AM  Staffing Resident/CRNA: Junious SilkNOLES, Anatole Apollo Performed: resident/CRNA   Preanesthetic Checklist Completed: patient identified, site marked, surgical consent, pre-op evaluation, timeout performed, IV checked, risks and benefits discussed and monitors and equipment checked  Epidural Patient position: sitting Prep: Betadine Patient monitoring: heart rate, continuous pulse ox and blood pressure Approach: midline Location: L3-L4 Injection technique: LOR saline  Needle:  Needle type: Tuohy  Needle gauge: 18 G Needle length: 9 cm and 9 Catheter type: closed end flexible Catheter size: 20 Guage Test dose: negative and 1.5% lidocaine with Epi 1:200 K  Assessment Events: blood not aspirated, injection not painful, no injection resistance, negative IV test and no paresthesia  Additional Notes   Patient tolerated the insertion well without complications.Reason for block:procedure for pain

## 2016-08-20 NOTE — Discharge Instructions (Signed)

## 2016-08-20 NOTE — Anesthesia Postprocedure Evaluation (Signed)
Anesthesia Post Note  Patient: Colleen ParishMaria J Perez Thomas  Procedure(s) Performed: * No procedures listed *  Patient location during evaluation: Mother Baby Anesthesia Type: Epidural Level of consciousness: awake and alert Pain management: pain level controlled Vital Signs Assessment: post-procedure vital signs reviewed and stable Respiratory status: spontaneous breathing Cardiovascular status: stable Postop Assessment: no headache, no backache and patient able to bend at knees Anesthetic complications: no    Last Vitals:  Vitals:   08/20/16 0013 08/20/16 0446  BP: (!) 97/55 (!) 92/52  Pulse: 62 63  Resp: 18 16  Temp: 36.8 C 36.9 C    Last Pain:  Vitals:   08/20/16 0614  TempSrc:   PainSc: 6                  Jules SchickLogan,  Zykeriah Mathia P

## 2017-02-10 ENCOUNTER — Encounter: Payer: Self-pay | Admitting: Emergency Medicine

## 2017-02-10 DIAGNOSIS — M545 Low back pain: Secondary | ICD-10-CM | POA: Insufficient documentation

## 2017-02-10 NOTE — ED Triage Notes (Signed)
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 Pt is AOx4 in no apparent distress.

## 2017-02-11 ENCOUNTER — Emergency Department
Admission: EM | Admit: 2017-02-11 | Discharge: 2017-02-11 | Disposition: A | Discharge status: Home / Self Care | Payer: Medicaid Other | Attending: Emergency Medicine | Admitting: Emergency Medicine

## 2017-02-11 DIAGNOSIS — M545 Low back pain, unspecified: Secondary | ICD-10-CM

## 2017-02-11 MED ORDER — KETOROLAC TROMETHAMINE 10 MG PO TABS
10.0000 mg | ORAL_TABLET | Freq: Once | ORAL | Status: AC
  Administered 2017-02-11: 10 mg via ORAL
  Filled 2017-02-11 (×2): qty 1

## 2017-02-11 MED ORDER — CYCLOBENZAPRINE HCL 10 MG PO TABS: 10.0000 mg | ORAL_TABLET | Freq: Three times a day (TID) | ORAL | 0 refills | Status: DC | PRN

## 2017-02-11 MED ORDER — CYCLOBENZAPRINE HCL 10 MG PO TABS
5.0000 mg | ORAL_TABLET | Freq: Once | ORAL | Status: AC
  Administered 2017-02-11: 5 mg via ORAL
  Filled 2017-02-11: qty 1

## 2017-02-11 MED ORDER — TRAMADOL HCL 50 MG PO TABS: 50.0000 mg | ORAL_TABLET | Freq: Once | ORAL | Status: DC

## 2017-02-11 NOTE — ED Notes (Signed)
Pt reports that at work tonight her back gradually began to hurt. Pt describes pain as pressure. Pt denies any sudden "pop" or "sharp pain." Pt states she was going to go home and notified her supervisor who suggested she come to the emergency department. Pt asked if this was going to be filed workers comp but stated no. Pt states her supervisor did not give her and papers and said if he needed to he would "sign hospital papers." Asked pt for permission to call supervisor to clarify and pt said she did not have his number and he wouldn't be in the office or answer calls after hours. Pt asked if she wanted me to further investigate clarification on filing workers comp but pt stated no.

## 2017-02-11 NOTE — ED Provider Notes (Signed)
[facility] Emergency Department Provider Note   First MD Initiated Contact with Patient 02/11/17 [phone]     (approximate)  I have reviewed the triage vital signs and the nursing notes.   HISTORY  Chief Complaint Back Pain    HPI Colleen Thomas is a 36 y.o. female presents acute onset of low back pain which started while pushing a heavy cart at work last night. Patient denies any radiation of the pain to lower extremities. Patient denies any lower extremity weakness numbness or gait instability. Patient denies any urinary or bowel changes. Patient states her current pain score is 8 out of 10. Patient states that pain is worse with movement. Patient denies any alleviating factors.   Past Medical History:  Diagnosis Date  . AMA (advanced maternal age) multigravida 35+   . Anemia   . Gestational diabetes 2015   glyburide    Patient Active Problem List   Diagnosis Date Noted  . Uterine contractions during pregnancy 08/19/2016  . Labor and delivery, indication for care 05/13/2016  . Active labor 12/08/2014  . [redacted] weeks gestation of pregnancy   . Gestational diabetes mellitus, currently pregnant 10/09/2014  . AMA (advanced maternal age) multigravida 35+ 10/09/2014  . Late prenatal care 10/09/2014  . Illiteracy 10/09/2014  . Echogenic bowel of fetus on prenatal ultrasound 10/09/2014    Past Surgical History:  Procedure Laterality Date  . CHOLECYSTECTOMY      Prior to Admission medications   Medication Sig Start Date End Date Taking? Authorizing Provider  ferrous sulfate 325 (65 FE) MG EC tablet Take 325 mg by mouth daily.    Historical Provider, MD  Prenatal Vit-Fe Fumarate-FA (PRENATAL MULTIVITAMIN) TABS tablet Take 1 tablet by mouth daily at 12 noon.    Historical Provider, MD    Allergies Patient has no known allergies.  Family History  Problem Relation Age of Onset  . Alcohol abuse Neg Hx   . Arthritis Neg Hx   . Asthma Neg Hx   .  Birth defects Neg Hx   . Cancer Neg Hx   . COPD Neg Hx   . Depression Neg Hx   . Diabetes Neg Hx   . Drug abuse Neg Hx   . Early death Neg Hx   . Hearing loss Neg Hx   . Heart disease Neg Hx   . Hyperlipidemia Neg Hx   . Hypertension Neg Hx   . Kidney disease Neg Hx   . Learning disabilities Neg Hx   . Mental illness Neg Hx   . Mental retardation Neg Hx   . Miscarriages / Stillbirths Neg Hx   . Stroke Neg Hx   . Vision loss Neg Hx   . Varicose Veins Neg Hx     Social History Social History  Substance Use Topics  . Smoking status: Never Smoker  . Smokeless tobacco: Never Used  . Alcohol use No    Review of Systems Constitutional: No fever/chills Eyes: No visual changes. ENT: No sore throat. Cardiovascular: Denies chest pain. Respiratory: Denies shortness of breath. Gastrointestinal: No abdominal pain.  No nausea, no vomiting.  No diarrhea.  No constipation. Genitourinary: Negative for dysuria. Musculoskeletal: Positive for back pain. Skin: Negative for rash. Neurological: Negative for headaches, focal weakness or numbness.  10-point ROS otherwise negative.  ____________________________________________   PHYSICAL EXAM:  VITAL SIGNS: ED Triage Vitals  Enc Vitals Group     BP 02/10/17 2251 100/80     Pulse Rate  02/10/17 2251 76     Resp 02/10/17 2251 18     Temp 02/10/17 2251 99.1 F (37.3 C)     Temp Source 02/10/17 2251 Oral     SpO2 02/10/17 2251 100 %     Weight 02/10/17 2251 135 lb (61.2 kg)     Height 02/10/17 2251  (1.422 m)     Head Circumference --      Peak Flow --      Pain Score 02/10/17 2250 8     Pain Loc --      Pain Edu? --      Excl. in GC? --     Constitutional: Alert and oriented. Well appearing and in no acute distress. Eyes: Conjunctivae are normal. PERRL. EOMI. Head: Atraumatic. Mouth/Throat: Mucous membranes are moist.  Oropharynx non-erythematous Neck: No stridor.   Cardiovascular: Normal rate, regular rhythm. Good  peripheral circulation. Grossly normal heart sounds. Respiratory: Normal respiratory effort.  No retractions. Lungs CTAB. Gastrointestinal: Soft and nontender. No distention.  Musculoskeletal: No lower extremity tenderness nor edema. No gross deformities of extremities. Pain with lumbar paraspinal muscle palpation Neurologic:  Normal speech and language. No gross focal neurologic deficits are appreciated.  Skin:  Skin is warm, dry and intact. No rash noted. Psychiatric: Mood and affect are normal. Speech and behavior are normal.    Procedures   ____________________________________________   INITIAL IMPRESSION / ASSESSMENT AND PLAN / ED COURSE  Pertinent labs & imaging results that were available during my care of the patient were reviewed by me and considered in my medical decision making (see chart for details).  History physical exam consistent with lumbar paraspinal muscle spasms. However unable to evaluate for possible herniated disc. Patient is advised to follow up with orthopedic surgeon Dr. Ernest Pine if pain persists despite muscle relaxant therapy.      ____________________________________________  FINAL CLINICAL IMPRESSION(S) / ED DIAGNOSES  Final diagnoses:  Acute bilateral low back pain without sciatica     MEDICATIONS GIVEN DURING THIS VISIT:  Medications  cyclobenzaprine (FLEXERIL) tablet 5 mg (not administered)  traMADol (ULTRAM) tablet 50 mg (not administered)     NEW OUTPATIENT MEDICATIONS STARTED DURING THIS VISIT:  New Prescriptions   No medications on file    Modified Medications   No medications on file    Discontinued Medications   No medications on file     Note:  This document was prepared using Dragon voice recognition software and may include unintentional dictation errors.    Darci Current, MD 02/12/17 Marlyne Beards

## 2017-03-04 IMAGING — US US OB TRANSVAGINAL
1 series · 13 of 28 positions shown · non-contrast
Comparison: None for this pregnancy

CLINICAL DATA: 34-year-old pregnant female with left lower quadrant
abdominal pain



[Series 1: us ob transvaginal · 0.23mm/px · 13 of 65 slices shown]
[im 3/65]
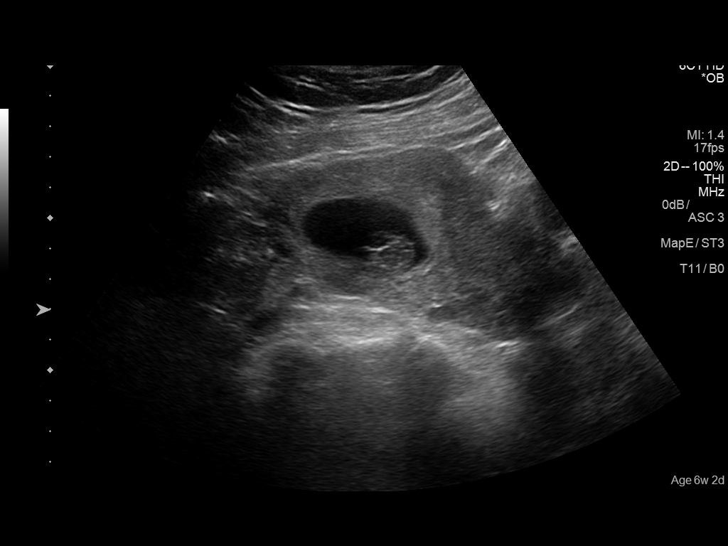
[im 8/65]
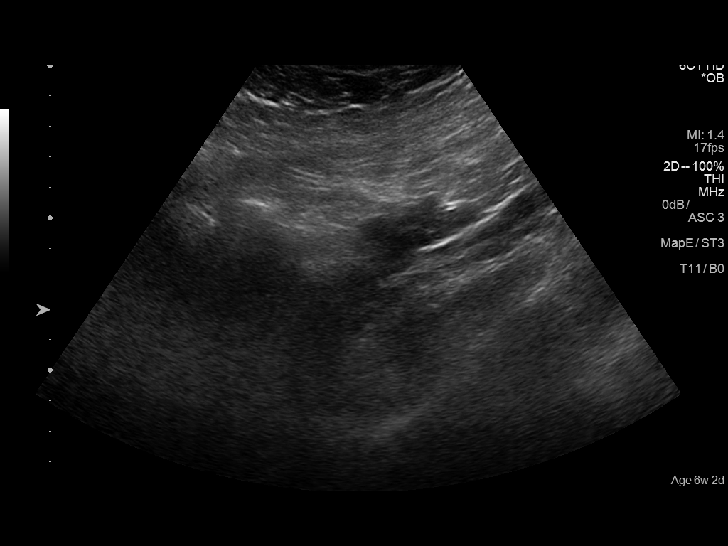
[im 12/65]
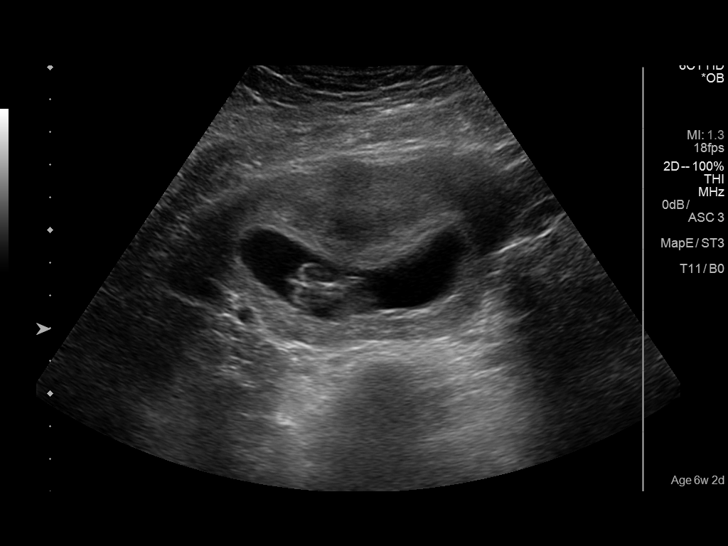
[im 17/65]
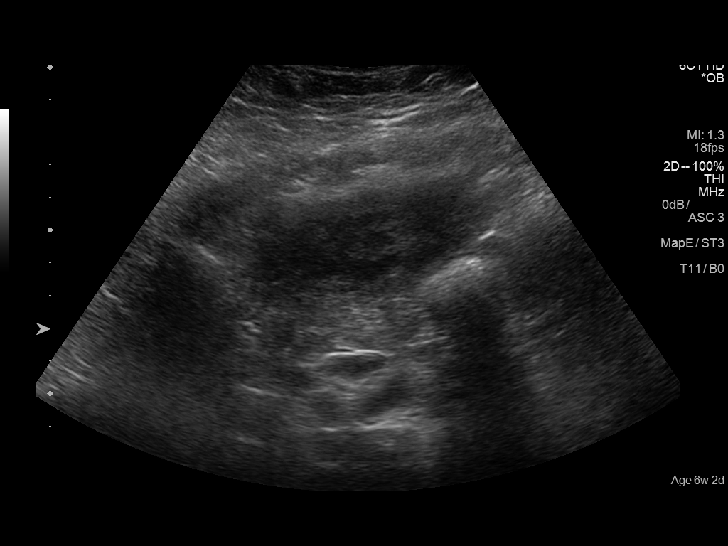
[im 22/65]
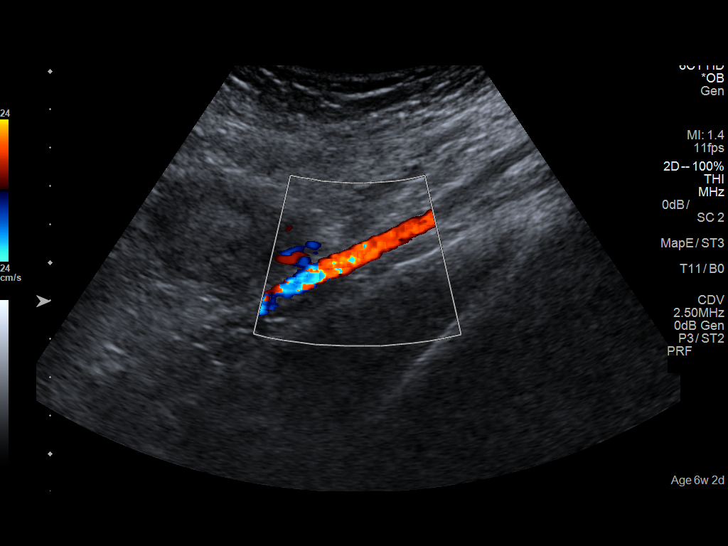
[im 27/65]
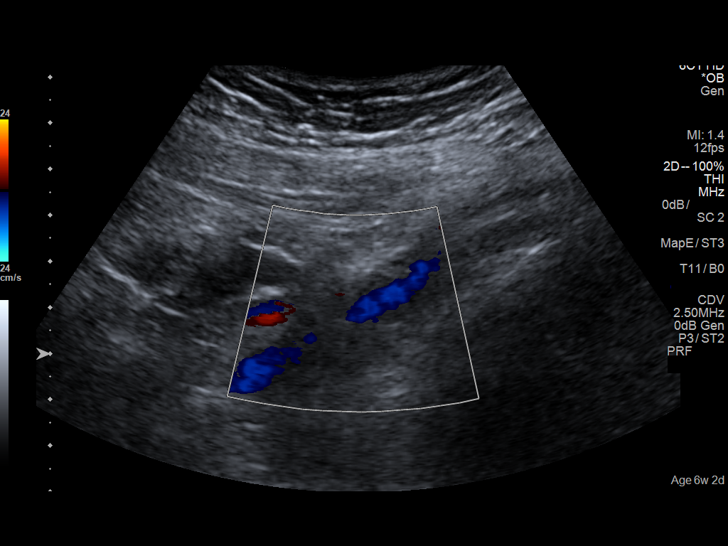
[im 34/65]
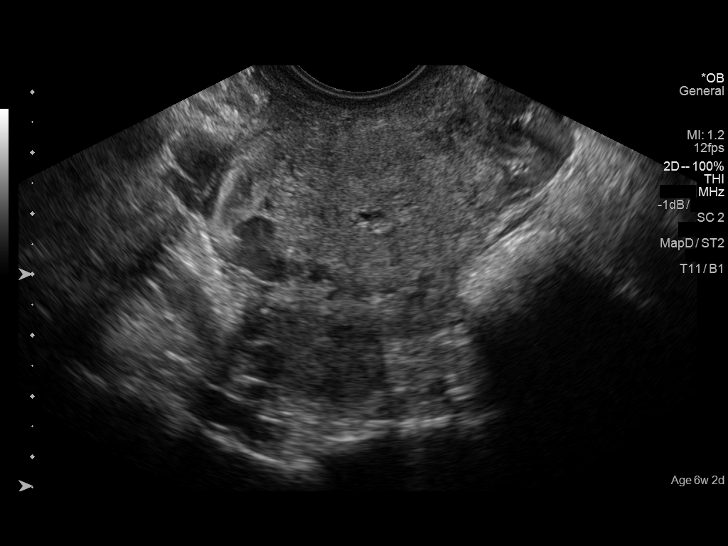
[im 38/65]
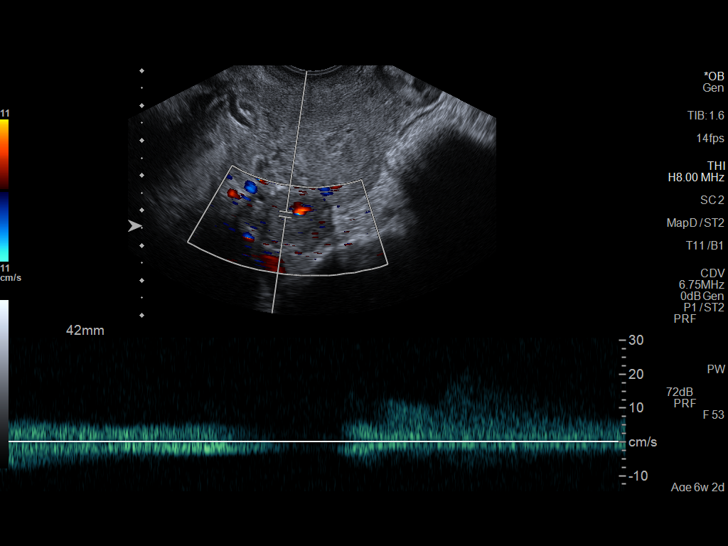
[im 43/65]
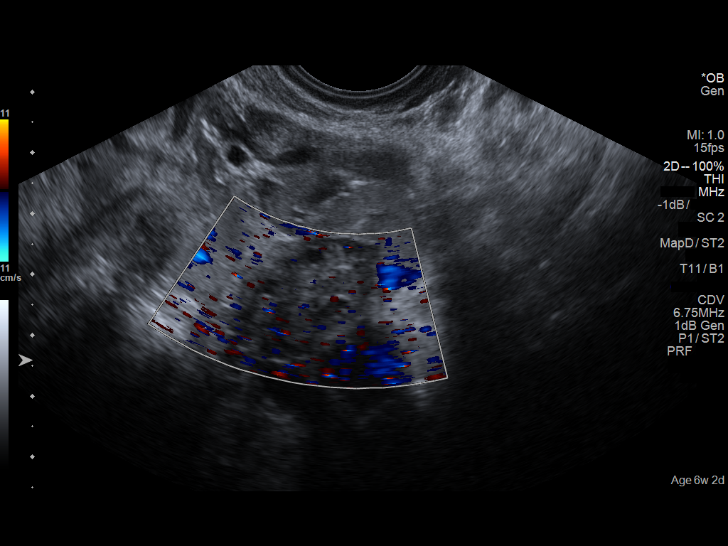
[im 48/65]
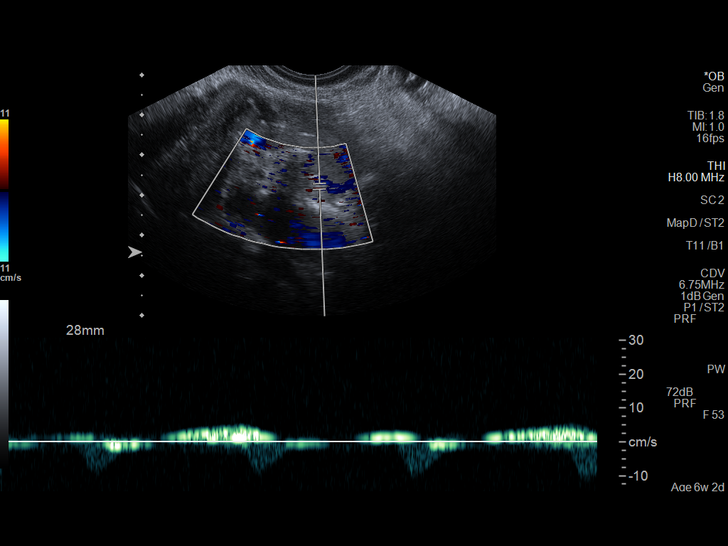
[im 53/65]
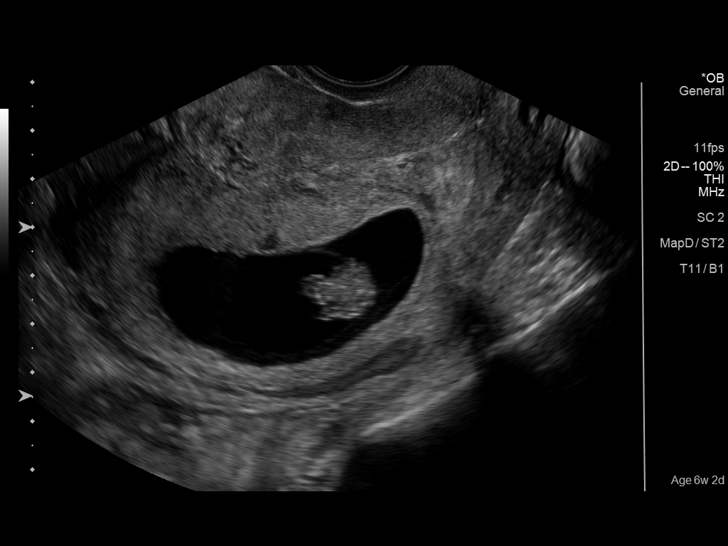
[im 57/65]
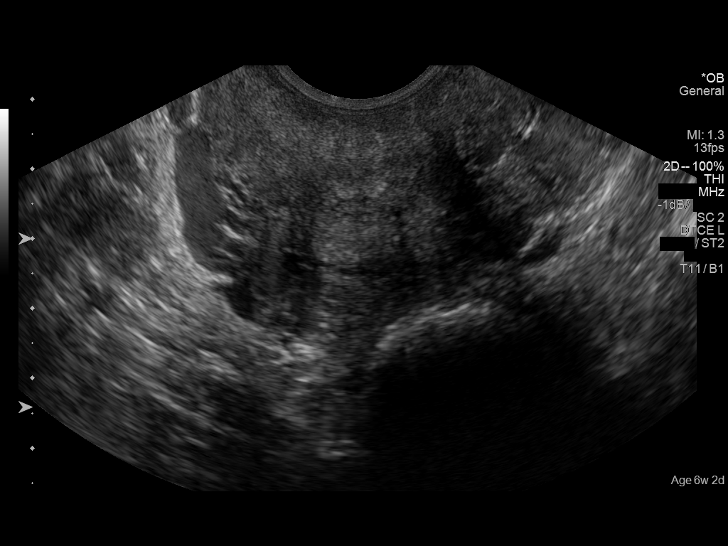
[im 62/65]
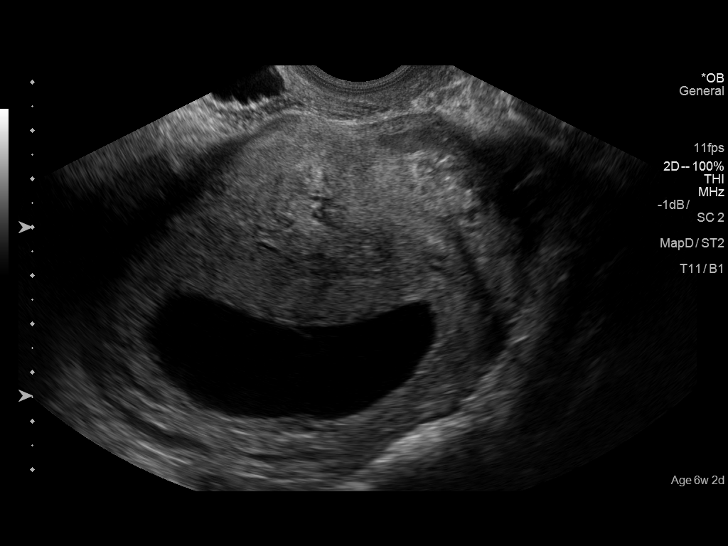

[13 of 28 positions shown; findings below may reference images not displayed]

FINDINGS: Intrauterine gestational sac: Single intrauterine gestational sac

Yolk sac:  Seen

Embryo:  Present

Cardiac Activity: Defect

Heart Rate: 163 Bpm

CRL:   32  Mm   10 w 0 d                  US EDC: 08/27/2016

Subchorionic hemorrhage:  None visualized.

Maternal uterus/adnexae: The maternal ovaries appear unremarkable.
The right ovary measures 2.5 x 1.4 x 1.6 cm and the left ovary
measures 2.4 x 1.8 x 2.0 cm.

Pulsed Doppler evaluation of both ovaries demonstrates normal
appearing low-resistance arterial and venous waveforms.
IMPRESSION: Single live intrauterine pregnancy with an estimated gestational age
of 10 weeks, 0 days based on today's ultrasound.

Unremarkable maternal ovaries.

## 2017-11-25 ENCOUNTER — Encounter: Payer: Self-pay | Admitting: Emergency Medicine

## 2017-11-25 ENCOUNTER — Emergency Department: Payer: No Typology Code available for payment source

## 2017-11-25 ENCOUNTER — Emergency Department
Admission: EM | Admit: 2017-11-25 | Discharge: 2017-11-25 | Disposition: A | Discharge status: Home / Self Care | Payer: No Typology Code available for payment source | Attending: Emergency Medicine | Admitting: Emergency Medicine

## 2017-11-25 DIAGNOSIS — Z79899 Other long term (current) drug therapy: Secondary | ICD-10-CM | POA: Insufficient documentation

## 2017-11-25 DIAGNOSIS — M25512 Pain in left shoulder: Secondary | ICD-10-CM | POA: Insufficient documentation

## 2017-11-25 DIAGNOSIS — M542 Cervicalgia: Secondary | ICD-10-CM | POA: Insufficient documentation

## 2017-11-25 MED ORDER — CYCLOBENZAPRINE HCL 10 MG PO TABS
5.0000 mg | ORAL_TABLET | Freq: Once | ORAL | Status: AC
  Administered 2017-11-25: 5 mg via ORAL
  Filled 2017-11-25: qty 1

## 2017-11-25 MED ORDER — MELOXICAM 7.5 MG PO TABS
15.0000 mg | ORAL_TABLET | Freq: Every day | ORAL | Status: DC
  Administered 2017-11-25: 15 mg via ORAL
  Filled 2017-11-25: qty 2

## 2017-11-25 MED ORDER — MELOXICAM 7.5 MG PO TABS: 7.5000 mg | ORAL_TABLET | Freq: Every day | ORAL | 1 refills | Status: AC

## 2017-11-25 MED ORDER — CYCLOBENZAPRINE HCL 5 MG PO TABS: 5.0000 mg | ORAL_TABLET | Freq: Three times a day (TID) | ORAL | 0 refills | Status: AC | PRN
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
## 2017-11-25 NOTE — ED Notes (Signed)
Interpreter requested 

## 2021-09-10 ENCOUNTER — Encounter: Payer: Self-pay | Admitting: Emergency Medicine

## 2021-09-10 ENCOUNTER — Emergency Department: Payer: Self-pay

## 2021-09-10 ENCOUNTER — Emergency Department
Admission: EM | Admit: 2021-09-10 | Discharge: 2021-09-10 | Disposition: A | Discharge status: Home / Self Care | Payer: Self-pay | Attending: Emergency Medicine | Admitting: Emergency Medicine

## 2021-09-10 ENCOUNTER — Other Ambulatory Visit: Payer: Self-pay

## 2021-09-10 DIAGNOSIS — R509 Fever, unspecified: Secondary | ICD-10-CM

## 2021-09-10 DIAGNOSIS — J111 Influenza due to unidentified influenza virus with other respiratory manifestations: Secondary | ICD-10-CM

## 2021-09-10 DIAGNOSIS — N390 Urinary tract infection, site not specified: Secondary | ICD-10-CM | POA: Insufficient documentation

## 2021-09-10 DIAGNOSIS — Z20822 Contact with and (suspected) exposure to covid-19: Secondary | ICD-10-CM | POA: Insufficient documentation

## 2021-09-10 DIAGNOSIS — J101 Influenza due to other identified influenza virus with other respiratory manifestations: Secondary | ICD-10-CM | POA: Insufficient documentation

## 2021-09-10 LAB — URINALYSIS, ROUTINE W REFLEX MICROSCOPIC
Bacteria, UA: NONE SEEN
Bilirubin Urine: NEGATIVE
Glucose, UA: NEGATIVE mg/dL
Ketones, ur: 20 mg/dL — AB
Nitrite: NEGATIVE
Protein, ur: 30 mg/dL — AB
Specific Gravity, Urine: 1.029 (ref 1.005–1.030)
pH: 5 (ref 5.0–8.0)

## 2021-09-10 LAB — COMPREHENSIVE METABOLIC PANEL
ALT: 61 U/L — ABNORMAL HIGH (ref 0–44)
AST: 50 U/L — ABNORMAL HIGH (ref 15–41)
Albumin: 4 g/dL (ref 3.5–5.0)
Alkaline Phosphatase: 71 U/L (ref 38–126)
Anion gap: 7 (ref 5–15)
BUN: 12 mg/dL (ref 6–20)
CO2: 24 mmol/L (ref 22–32)
Calcium: 8.5 mg/dL — ABNORMAL LOW (ref 8.9–10.3)
Chloride: 100 mmol/L (ref 98–111)
Creatinine, Ser: 0.64 mg/dL (ref 0.44–1.00)
GFR, Estimated: >60 mL/min (ref >60)
Glucose, Bld: 142 mg/dL — ABNORMAL HIGH (ref 70–99)
Potassium: 3.3 mmol/L — ABNORMAL LOW (ref 3.5–5.1)
Sodium: 131 mmol/L — ABNORMAL LOW (ref 135–145)
Total Bilirubin: 1.1 mg/dL (ref 0.3–1.2)
Total Protein: 8.2 g/dL — ABNORMAL HIGH (ref 6.5–8.1)

## 2021-09-10 LAB — CBC WITH DIFFERENTIAL/PLATELET
Abs Immature Granulocytes: 0.01 10*3/uL (ref 0.00–0.07)
Basophils Absolute: 0 10*3/uL (ref 0.0–0.1)
Basophils Relative: 0 %
Eosinophils Absolute: 0 10*3/uL (ref 0.0–0.5)
Eosinophils Relative: 0 %
HCT: 39.3 % (ref 36.0–46.0)
Hemoglobin: 14.1 g/dL (ref 12.0–15.0)
Immature Granulocytes: 0 %
Lymphocytes Relative: 9 %
Lymphs Abs: 0.7 10*3/uL (ref 0.7–4.0)
MCH: 33.1 pg (ref 26.0–34.0)
MCHC: 35.9 g/dL (ref 30.0–36.0)
MCV: 92.3 fL (ref 80.0–100.0)
Monocytes Absolute: 0.6 10*3/uL (ref 0.1–1.0)
Monocytes Relative: 9 %
Neutro Abs: 6 10*3/uL (ref 1.7–7.7)
Neutrophils Relative %: 82 %
Platelets: 216 10*3/uL (ref 150–400)
RBC: 4.26 MIL/uL (ref 3.87–5.11)
RDW: 13.2 % (ref 11.5–15.5)
WBC: 7.3 10*3/uL (ref 4.0–10.5)
nRBC: 0 % (ref 0.0–0.2)

## 2021-09-10 LAB — LIPASE, BLOOD: Lipase: 37 U/L (ref 11–51)

## 2021-09-10 LAB — RESP PANEL BY RT-PCR (FLU A&B, COVID) ARPGX2
Influenza A by PCR: POSITIVE — AB
Influenza B by PCR: NEGATIVE
SARS Coronavirus 2 by RT PCR: NEGATIVE

## 2021-09-10 LAB — PREGNANCY, URINE: Preg Test, Ur: NEGATIVE

## 2021-09-10 LAB — TROPONIN I (HIGH SENSITIVITY): Troponin I (High Sensitivity): 4 ng/L (ref <18)

## 2021-09-10 MED ORDER — CEPHALEXIN 500 MG PO CAPS: 500.0000 mg | ORAL_CAPSULE | Freq: Three times a day (TID) | ORAL | 0 refills | Status: AC

## 2021-09-10 MED ORDER — KETOROLAC TROMETHAMINE 30 MG/ML IJ SOLN
15.0000 mg | Freq: Once | INTRAMUSCULAR | Status: AC
  Administered 2021-09-10: 15 mg via INTRAVENOUS
  Filled 2021-09-10: qty 1

## 2021-09-10 MED ORDER — ACETAMINOPHEN 500 MG PO TABS
1000.0000 mg | ORAL_TABLET | Freq: Once | ORAL | Status: AC
  Administered 2021-09-10: 1000 mg via ORAL
  Filled 2021-09-10: qty 2

## 2021-09-10 MED ORDER — OSELTAMIVIR PHOSPHATE 75 MG PO CAPS: 75.0000 mg | ORAL_CAPSULE | Freq: Two times a day (BID) | ORAL | 0 refills | Status: AC

## 2021-09-10 MED ORDER — SODIUM CHLORIDE 0.9 % IV BOLUS
1000.0000 mL | Freq: Once | INTRAVENOUS | Status: AC
  Administered 2021-09-10: 1000 mL via INTRAVENOUS

## 2021-09-10 MED ORDER — CEPHALEXIN 500 MG PO CAPS
500.0000 mg | ORAL_CAPSULE | Freq: Once | ORAL | Status: AC
Start: 2021-09-10 — End: 2021-09-10
  Administered 2021-09-10: 500 mg via ORAL
  Filled 2021-09-10: qty 1

## 2021-09-10 MED ORDER — ONDANSETRON 4 MG PO TBDP: 4.0000 mg | ORAL_TABLET | Freq: Three times a day (TID) | ORAL | 0 refills | Status: AC | PRN

## 2021-09-10 NOTE — ED Provider Notes (Signed)
Emergency Medicine Provider Triage Evaluation Note  Colleen Thomas , a 40 y.o. female  was evaluated in triage.  Pt complains of generalized weakness, body.  Review of Systems  Positive: Weakness Negative: Vomiting  Physical Exam  There were no vitals taken for this visit. Gen:   Awake, mild distress   Resp:  Normal effort  MSK:   Moves extremities without difficulty  Other:    Medical Decision Making  Medically screening exam initiated at 3:11 AM.  Appropriate orders placed.  Colleen Thomas was informed that the remainder of the evaluation will be completed by another provider, this initial triage assessment does not replace that evaluation, and the importance of remaining in the ED until their evaluation is complete.  40 year old female presenting with generalized weakness, low-grade temperature noted.  Will obtain lab work, COVID swab, chest x-ray.  Patient will be evaluated by provider when she receives her treatment room.   Colleen Hong, MD 09/10/21 [phone]

## 2021-09-10 NOTE — ED Triage Notes (Signed)
Pt to triage via w/c with no distress noted; pt reports body aches, HA and fever since this morning

## 2021-09-10 NOTE — ED Provider Notes (Signed)
[facility] Emergency Department Provider Note   ____________________________________________   Event Date/Time   First MD Initiated Contact with Patient 09/10/21 [phone]     (approximate)  I have reviewed the triage vital signs and the nursing notes.   HISTORY  Chief Complaint Fever    HPI Colleen Thomas is a 40 y.o. female who presents to the ED from home with a chief complaint of body aches, headaches and fever x1 day.  Also endorses dry cough.  Denies chest pain, shortness of breath, abdominal pain, nausea, vomiting or dizziness.      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 Authorizing Provider  cephALEXin (KEFLEX) 500 MG capsule Take 1 capsule (500 mg total) by mouth 3 (three) times daily. 09/10/21  Yes [name], MD  ondansetron (ZOFRAN ODT) 4 MG disintegrating tablet Take 1 tablet (4 mg total) by mouth every 8 (eight) hours as needed for nausea or vomiting. 09/10/21  Yes [name], MD  oseltamivir (TAMIFLU) 75 MG capsule Take 1 capsule (75 mg total) by mouth 2 (two) times daily. 09/10/21  Yes [name], MD  ferrous sulfate 325 (65 FE) MG EC tablet Take 325 mg by mouth daily.    [provider]  Prenatal Vit-Fe  Fumarate-FA (PRENATAL MULTIVITAMIN) TABS tablet Take 1 tablet by mouth daily at 12 noon.    [provider]    Allergies Patient has no known allergies.  Family History  Problem Relation Age of Onset   Alcohol abuse Neg Hx    Arthritis Neg Hx    Asthma Neg Hx    Birth defects Neg Hx    Cancer Neg Hx    COPD Neg Hx    Depression Neg Hx    Diabetes Neg Hx    Drug abuse Neg Hx    Early death Neg Hx    Hearing loss Neg Hx    Heart disease Neg Hx    Hyperlipidemia Neg Hx    Hypertension Neg Hx    Kidney disease Neg Hx    Learning disabilities Neg Hx    Mental illness Neg Hx    Mental retardation Neg Hx    Miscarriages / Stillbirths Neg Hx    Stroke Neg Hx    Vision loss Neg Hx    Varicose Veins Neg Hx     Social History Social History   Tobacco Use   Smoking status: Never   Smokeless tobacco: Never  Vaping Use   Vaping Use: Never used  Substance Use Topics   Alcohol use: No   Drug use: No    Review of Systems  Constitutional: Positive for fever and body aches. Eyes: No visual changes. ENT: No sore  throat. Cardiovascular: Denies chest pain. Respiratory: Positive for cough.  Denies shortness of breath. Gastrointestinal: No abdominal pain.  No nausea, no vomiting.  No diarrhea.  No constipation. Genitourinary: Negative for dysuria. Musculoskeletal: Negative for back pain. Skin: Negative for rash. Neurological: Positive for headache. Negative for focal weakness or numbness.   ____________________________________________   PHYSICAL EXAM:  VITAL SIGNS: ED Triage Vitals  Enc Vitals Group     BP 09/10/21 0312 (!) 111/50     Pulse Rate 09/10/21 0312 (!) 108     Resp 09/10/21 0312 20     Temp 09/10/21 0312 (!) 101 F (38.3 C)     Temp Source 09/10/21 0312 Oral     SpO2 09/10/21 0312 97 %     Weight 09/10/21 0313 140 lb (63.5 kg)     Height 09/10/21 0313 5\' 1"  (1.549 m)     Head Circumference --      Peak Flow --      Pain Score 09/10/21 0315 8      Pain Loc --      Pain Edu? --      Excl. in GC? --     Constitutional: Alert and oriented. Well appearing and in mild acute distress. Eyes: Conjunctivae are normal. PERRL. EOMI. Head: Atraumatic. Nose: Congestion/rhinnorhea. Mouth/Throat: Mucous membranes are moist.  Oropharynx non-erythematous. Neck: No stridor.  Supple neck without meningismus. Hematological/Lymphatic/Immunilogical: No cervical lymphadenopathy. Cardiovascular: Normal rate, regular rhythm. Grossly normal heart sounds.  Good peripheral circulation. Respiratory: Normal respiratory effort.  No retractions. Lungs CTAB. Gastrointestinal: Soft and nontender to light or deep palpation. No distention. No abdominal bruits. No CVA tenderness. Musculoskeletal: No lower extremity tenderness nor edema.  No joint effusions. Neurologic:  Normal speech and language. No gross focal neurologic deficits are appreciated. No gait instability. Skin:  Skin is warm, dry and intact. No rash noted.  No petechiae. Psychiatric: Mood and affect are normal. Speech and behavior are normal.  ____________________________________________   LABS (all labs ordered are listed, but only abnormal results are displayed)  Labs Reviewed  RESP PANEL BY RT-PCR (FLU A&B, COVID) ARPGX2 - Abnormal; Notable for the following components:      Result Value   Influenza A by PCR POSITIVE (*)    All other components within normal limits  COMPREHENSIVE METABOLIC PANEL - Abnormal; Notable for the following components:   Sodium 131 (*)    Potassium 3.3 (*)    Glucose, Bld 142 (*)    Calcium 8.5 (*)    Total Protein 8.2 (*)    AST 50 (*)    ALT 61 (*)    All other components within normal limits  URINALYSIS, ROUTINE W REFLEX MICROSCOPIC - Abnormal; Notable for the following components:   Color, Urine AMBER (*)    APPearance CLOUDY (*)    Hgb urine dipstick SMALL (*)    Ketones, ur 20 (*)    Protein, ur 30 (*)    Leukocytes,Ua MODERATE (*)    All other  components within normal limits  CBC WITH DIFFERENTIAL/PLATELET  LIPASE, BLOOD  PREGNANCY, URINE  TROPONIN I (HIGH SENSITIVITY)   ____________________________________________  EKG  ED ECG REPORT I, Oaklee Esther J, the attending physician, personally viewed and interpreted this ECG.   Date: 09/10/2021  EKG Time: 0322  Rate: 97  Rhythm: normal sinus rhythm  Axis: Normal  Intervals:none  ST&T Change: Nonspecific  ____________________________________________  RADIOLOGY I, Danile Trier J, personally viewed and evaluated these images (plain radiographs) as part of my medical  decision making, as well as reviewing the written report by the radiologist.  ED MD interpretation: No acute cardiopulmonary process  Official radiology report(s): DG Chest 2 View  Result Date: 09/10/2021 CLINICAL DATA:  40 year old female with history of weakness. EXAM: CHEST - 2 VIEW COMPARISON:  No priors. FINDINGS: Lung volumes are normal. No consolidative airspace disease. No pleural effusions. No pneumothorax. No pulmonary nodule or mass noted. Pulmonary vasculature and the cardiomediastinal silhouette are within normal limits. IMPRESSION: No radiographic evidence of acute cardiopulmonary disease. Electronically Signed   By: Trudie Reed M.D.   On: 09/10/2021 05:00    ____________________________________________   PROCEDURES  Procedure(s) performed (including Critical Care):  Procedures   ____________________________________________   INITIAL IMPRESSION / ASSESSMENT AND PLAN / ED COURSE  As part of my medical decision making, I reviewed the following data within the electronic MEDICAL RECORD NUMBER History obtained from family, Nursing notes reviewed and incorporated, Labs reviewed, EKG interpreted, Old chart reviewed, Radiograph reviewed, and Notes from prior ED visits     40 year old female presenting with fever, cough, body aches.  Diagnosis includes but is not limited to viral process such as  COVID-19, influenza, community-acquired pneumonia.  Laboratory results remarkable for leukocyte positive UTI.  Chest x-ray negative for pneumonia.  Patient is positive for influenza A.  Will administer IV hydration, Toradol, Keflex and patient will be discharged home on Keflex, Tamiflu and Zofran to use as needed.  Strict return precautions given.  Patient and family member verbalized understanding agree with plan of care.      ____________________________________________   FINAL CLINICAL IMPRESSION(S) / ED DIAGNOSES  Final diagnoses:  Fever, unspecified fever cause  Influenza  Lower urinary tract infectious disease     ED Discharge Orders          Ordered    oseltamivir (TAMIFLU) 75 MG capsule  2 times daily        09/10/21 0537    ondansetron (ZOFRAN ODT) 4 MG disintegrating tablet  Every 8 hours PRN        09/10/21 0537    cephALEXin (KEFLEX) 500 MG capsule  3 times daily        09/10/21 0537             Note:  This document was prepared using Dragon voice recognition software and may include unintentional dictation errors.    [name], MD 09/11/21 [phone]

## 2021-09-10 NOTE — Discharge Instructions (Signed)
1.  You may alternate Tylenol and Ibuprofen every 4 hours as needed for fever greater than 100.4 F. 2.  Take antibiotic as prescribed for UTI. 3.  You may take Tamiflu as prescribed for Influenza A. 4.  You may take Zofran as needed for nausea. 5.  Return to the ER for worsening symptoms, persistent vomiting, difficulty breathing or other concerns.

## 2021-09-10 NOTE — ED Notes (Signed)
Pt was suppose to be d/c, last set of vitals included a pressure with 70s systolic. Pt placed in room 51.

## 2021-09-10 NOTE — ED Notes (Addendum)
Patient was up for discharge. Tech went to do discharge vitals and found patient's BP to be 58/30 laying down. Changed patient position and repeated with result of 69/48. Patient transferred to bed for re-eval.

## 2024-01-05 ENCOUNTER — Encounter: Payer: Self-pay | Admitting: Obstetrics and Gynecology

## 2024-01-28 ENCOUNTER — Other Ambulatory Visit: Payer: Self-pay

## 2024-01-28 DIAGNOSIS — Z1231 Encounter for screening mammogram for malignant neoplasm of breast: Secondary | ICD-10-CM

## 2024-02-29 ENCOUNTER — Ambulatory Visit: Payer: Self-pay

## 2024-02-29 ENCOUNTER — Ambulatory Visit: Admission: RE | Admit: 2024-02-29 | Payer: Self-pay | Source: Ambulatory Visit

## 2024-02-29 ENCOUNTER — Telehealth: Payer: Self-pay | Admitting: *Deleted

## 2024-04-11 ENCOUNTER — Other Ambulatory Visit: Payer: Self-pay

## 2024-04-11 ENCOUNTER — Ambulatory Visit: Payer: Self-pay | Attending: Obstetrics and Gynecology | Admitting: *Deleted

## 2024-04-11 ENCOUNTER — Ambulatory Visit
Admission: RE | Admit: 2024-04-11 | Discharge: 2024-04-11 | Disposition: A | Discharge status: Home / Self Care | Payer: Self-pay | Source: Ambulatory Visit | Attending: Obstetrics and Gynecology | Admitting: Obstetrics and Gynecology

## 2024-04-11 VITALS — BP 112/66 | Wt 149.8 lb

## 2024-04-11 DIAGNOSIS — Z01419 Encounter for gynecological examination (general) (routine) without abnormal findings: Secondary | ICD-10-CM

## 2024-04-11 DIAGNOSIS — Z1231 Encounter for screening mammogram for malignant neoplasm of breast: Secondary | ICD-10-CM | POA: Insufficient documentation

## 2024-04-11 NOTE — Patient Instructions (Signed)
 Explained breast self awareness with Adria Hopkins. Pap smear completed today. Let her know [facility] will cover Pap smears and HPV typing every 5 years unless has a history of abnormal Pap smears. Referred patient to the [facility] for a screening mammogram. Appointment scheduled Monday, April 11, 2024 at 1440. Patient aware of appointment and will be there. Let patient know will follow up with her within the next couple weeks with results of Pap smear by letter or phone. Informed patient that Tony Frederickson will follow up with her within the next couple of weeks with results of her mammogram by letter or phone. Adria Hopkins verbalized understanding.  Dalaina Tates, Dela Favor, RN 2:31 PM

## 2024-04-11 NOTE — Progress Notes (Signed)
 [name] is a 43 y.o. [phone] female who presents to [facility] clinic today with no complaints.    Pap Smear: Pap smear completed today. Last Pap smear was 02/28/2014 at [facility] clinic and was normal. Per patient has no history of an abnormal Pap smear. Last Pap smear result is available in Epic.   Physical exam: Breasts Breasts symmetrical. No skin abnormalities bilateral breasts. No nipple retraction bilateral breasts. No nipple discharge bilateral breasts. No lymphadenopathy. No lumps palpated bilateral breasts. No complaints of pain or tenderness on exam.      Pelvic/Bimanual Ext Genitalia No lesions, no swelling and no discharge observed on external genitalia.        Vagina Vagina pink and normal texture. No lesions or discharge observed in vagina.        Cervix Cervix is present. Cervix pink and of normal texture. Blood observed at os consistent with the start of her menstrual period. IUD strings visualized.   Uterus Uterus is present and palpable. Uterus in normal position and normal size.        Adnexae Bilateral ovaries present and palpable. No tenderness on palpation.         Rectovaginal No rectal exam completed today since patient had no rectal complaints. No skin abnormalities observed on exam.     Smoking History: Patient has never smoked.   Patient Navigation: Patient education provided. Access to services provided for patient through [employer] program. Spanish interpreter Colleen Thomas from [facility] provided.   Breast and Cervical Cancer Risk Assessment: Patient has family history of maternal cousin having breast cancer. Patient has no known genetic mutations or history of radiation treatment to the chest before age 46. Patient does not have history of cervical dysplasia, immunocompromised, or DES exposure in-utero.  Risk Scores as of Encounter on 04/11/2024     Colleen Thomas           5-year 0.5%   Lifetime 6.46%   This patient is [place]/Latina  but has no documented birth country, so the [place] model used data from [place] patients to calculate their risk score. Document a birth country in the Demographics activity for a more accurate score.         Last calculated by Colleen Ingle, LPN on 11/12/2438 at  2:26 PM        A: [facility] exam with pap smear No complaints.  P: Referred patient to the [facility] for a screening mammogram. Appointment scheduled Monday, April 11, 2024 at 1440.  Colleen Edge, RN 04/11/2024 2:31 PM

## 2024-04-19 LAB — CYTOLOGY - PAP
Comment: NEGATIVE
Diagnosis: NEGATIVE
High risk HPV: NEGATIVE

## 2024-04-20 ENCOUNTER — Ambulatory Visit: Payer: Self-pay
